# Patient Record
Sex: Female | Born: 1960 | Race: White | Hispanic: Yes | Marital: Single | Smoking: Never smoker
Health system: Southern US, Community
[De-identification: ages and names within clinical notes are randomized; demographics above are authoritative.]

## PROBLEM LIST (undated history)

## (undated) DIAGNOSIS — K4020 Bilateral inguinal hernia, without obstruction or gangrene, not specified as recurrent: Secondary | ICD-10-CM

## (undated) DIAGNOSIS — N952 Postmenopausal atrophic vaginitis: Secondary | ICD-10-CM

## (undated) DIAGNOSIS — E785 Hyperlipidemia, unspecified: Secondary | ICD-10-CM

## (undated) DIAGNOSIS — L089 Local infection of the skin and subcutaneous tissue, unspecified: Secondary | ICD-10-CM

## (undated) DIAGNOSIS — N289 Disorder of kidney and ureter, unspecified: Secondary | ICD-10-CM

## (undated) HISTORY — DX: Hyperlipidemia, unspecified: E78.5

## (undated) HISTORY — DX: Postmenopausal atrophic vaginitis: N95.2

## (undated) HISTORY — DX: Local infection of the skin and subcutaneous tissue, unspecified: L08.9

## (undated) HISTORY — DX: Bilateral inguinal hernia, without obstruction or gangrene, not specified as recurrent: K40.20

---

## 1998-07-14 ENCOUNTER — Encounter: Payer: Self-pay | Admitting: *Deleted

## 1998-07-14 ENCOUNTER — Inpatient Hospital Stay (HOSPITAL_COMMUNITY): Admission: EM | Admit: 1998-07-14 | Discharge: 1998-07-17 | Payer: Self-pay | Admitting: Emergency Medicine

## 1998-07-16 ENCOUNTER — Encounter: Payer: Self-pay | Admitting: *Deleted

## 1998-08-24 ENCOUNTER — Emergency Department (HOSPITAL_COMMUNITY): Admission: EM | Admit: 1998-08-24 | Discharge: 1998-08-24 | Payer: Self-pay | Admitting: Emergency Medicine

## 2000-07-17 ENCOUNTER — Emergency Department (HOSPITAL_COMMUNITY): Admission: EM | Admit: 2000-07-17 | Discharge: 2000-07-17 | Payer: Self-pay | Admitting: Emergency Medicine

## 2001-11-18 ENCOUNTER — Emergency Department (HOSPITAL_COMMUNITY): Admission: EM | Admit: 2001-11-18 | Discharge: 2001-11-18 | Payer: Self-pay | Admitting: Emergency Medicine

## 2003-05-12 ENCOUNTER — Ambulatory Visit (HOSPITAL_COMMUNITY): Admission: RE | Admit: 2003-05-12 | Discharge: 2003-05-12 | Payer: Self-pay | Admitting: Internal Medicine

## 2004-07-14 ENCOUNTER — Ambulatory Visit: Payer: Self-pay | Admitting: Internal Medicine

## 2004-07-19 ENCOUNTER — Ambulatory Visit: Payer: Self-pay | Admitting: Family Medicine

## 2004-07-27 ENCOUNTER — Ambulatory Visit: Payer: Self-pay | Admitting: *Deleted

## 2004-07-30 ENCOUNTER — Ambulatory Visit (HOSPITAL_COMMUNITY): Admission: RE | Admit: 2004-07-30 | Discharge: 2004-07-30 | Payer: Self-pay | Admitting: Family Medicine

## 2004-08-04 ENCOUNTER — Ambulatory Visit: Payer: Self-pay | Admitting: Family Medicine

## 2004-08-10 ENCOUNTER — Encounter: Admission: RE | Admit: 2004-08-10 | Discharge: 2004-08-10 | Payer: Self-pay | Admitting: Internal Medicine

## 2004-08-27 ENCOUNTER — Ambulatory Visit: Payer: Self-pay | Admitting: Family Medicine

## 2004-09-17 ENCOUNTER — Ambulatory Visit: Payer: Self-pay | Admitting: Family Medicine

## 2004-10-22 ENCOUNTER — Ambulatory Visit: Payer: Self-pay | Admitting: Family Medicine

## 2005-01-20 ENCOUNTER — Emergency Department (HOSPITAL_COMMUNITY): Admission: EM | Admit: 2005-01-20 | Discharge: 2005-01-20 | Payer: Self-pay | Admitting: Emergency Medicine

## 2005-08-03 ENCOUNTER — Ambulatory Visit (HOSPITAL_COMMUNITY): Admission: RE | Admit: 2005-08-03 | Discharge: 2005-08-03 | Payer: Self-pay | Admitting: Family Medicine

## 2005-08-05 ENCOUNTER — Ambulatory Visit: Payer: Self-pay | Admitting: Internal Medicine

## 2005-09-23 ENCOUNTER — Encounter: Payer: Self-pay | Admitting: Internal Medicine

## 2005-09-23 ENCOUNTER — Encounter (INDEPENDENT_AMBULATORY_CARE_PROVIDER_SITE_OTHER): Payer: Self-pay | Admitting: Internal Medicine

## 2005-09-23 ENCOUNTER — Ambulatory Visit: Payer: Self-pay | Admitting: Internal Medicine

## 2005-09-23 LAB — CONVERTED CEMR LAB: Pap Smear: NORMAL

## 2006-08-22 ENCOUNTER — Ambulatory Visit: Payer: Self-pay | Admitting: Family Medicine

## 2006-08-24 ENCOUNTER — Ambulatory Visit (HOSPITAL_COMMUNITY): Admission: RE | Admit: 2006-08-24 | Discharge: 2006-08-24 | Payer: Self-pay | Admitting: Family Medicine

## 2006-10-11 ENCOUNTER — Ambulatory Visit: Payer: Self-pay | Admitting: Family Medicine

## 2006-12-11 ENCOUNTER — Encounter (INDEPENDENT_AMBULATORY_CARE_PROVIDER_SITE_OTHER): Payer: Self-pay | Admitting: Internal Medicine

## 2006-12-11 DIAGNOSIS — G5600 Carpal tunnel syndrome, unspecified upper limb: Secondary | ICD-10-CM

## 2006-12-13 ENCOUNTER — Encounter (INDEPENDENT_AMBULATORY_CARE_PROVIDER_SITE_OTHER): Payer: Self-pay | Admitting: *Deleted

## 2007-03-20 DIAGNOSIS — E785 Hyperlipidemia, unspecified: Secondary | ICD-10-CM

## 2007-05-14 ENCOUNTER — Telehealth (INDEPENDENT_AMBULATORY_CARE_PROVIDER_SITE_OTHER): Payer: Self-pay | Admitting: *Deleted

## 2007-05-22 ENCOUNTER — Ambulatory Visit: Payer: Self-pay | Admitting: Family Medicine

## 2007-05-22 DIAGNOSIS — M545 Low back pain: Secondary | ICD-10-CM

## 2007-05-22 DIAGNOSIS — F411 Generalized anxiety disorder: Secondary | ICD-10-CM | POA: Insufficient documentation

## 2007-05-22 DIAGNOSIS — K589 Irritable bowel syndrome without diarrhea: Secondary | ICD-10-CM

## 2008-04-21 ENCOUNTER — Encounter (INDEPENDENT_AMBULATORY_CARE_PROVIDER_SITE_OTHER): Payer: Self-pay | Admitting: Family Medicine

## 2008-05-16 ENCOUNTER — Encounter (INDEPENDENT_AMBULATORY_CARE_PROVIDER_SITE_OTHER): Payer: Self-pay | Admitting: Internal Medicine

## 2008-06-03 ENCOUNTER — Encounter (INDEPENDENT_AMBULATORY_CARE_PROVIDER_SITE_OTHER): Payer: Self-pay | Admitting: Internal Medicine

## 2008-06-12 ENCOUNTER — Ambulatory Visit: Payer: Self-pay | Admitting: Family Medicine

## 2008-06-12 DIAGNOSIS — R6889 Other general symptoms and signs: Secondary | ICD-10-CM | POA: Insufficient documentation

## 2008-06-18 ENCOUNTER — Telehealth (INDEPENDENT_AMBULATORY_CARE_PROVIDER_SITE_OTHER): Payer: Self-pay | Admitting: Family Medicine

## 2008-06-26 ENCOUNTER — Encounter (INDEPENDENT_AMBULATORY_CARE_PROVIDER_SITE_OTHER): Payer: Self-pay | Admitting: *Deleted

## 2008-10-22 ENCOUNTER — Ambulatory Visit: Payer: Self-pay | Admitting: Nurse Practitioner

## 2008-10-22 DIAGNOSIS — J00 Acute nasopharyngitis [common cold]: Secondary | ICD-10-CM | POA: Insufficient documentation

## 2008-10-22 DIAGNOSIS — M25569 Pain in unspecified knee: Secondary | ICD-10-CM | POA: Insufficient documentation

## 2008-10-22 LAB — CONVERTED CEMR LAB
BUN: 7 mg/dL (ref 6–23)
Basophils Absolute: 0 10*3/uL (ref 0.0–0.1)
Basophils Relative: 0 % (ref 0–1)
CO2: 25 meq/L (ref 19–32)
Calcium: 8.9 mg/dL (ref 8.4–10.5)
Cholesterol: 192 mg/dL (ref 0–200)
Creatinine, Ser: 0.49 mg/dL (ref 0.40–1.20)
Eosinophils Absolute: 0.1 10*3/uL (ref 0.0–0.7)
Eosinophils Relative: 2 % (ref 0–5)
Glucose, Bld: 87 mg/dL (ref 70–99)
HCT: 41.5 % (ref 36.0–46.0)
Hemoglobin: 13.3 g/dL (ref 12.0–15.0)
MCHC: 32 g/dL (ref 30.0–36.0)
MCV: 87.4 fL (ref 78.0–100.0)
Monocytes Absolute: 0.4 10*3/uL (ref 0.1–1.0)
Neutro Abs: 3.3 10*3/uL (ref 1.7–7.7)
RDW: 13.7 % (ref 11.5–15.5)
Total Bilirubin: 0.8 mg/dL (ref 0.3–1.2)
Total CHOL/HDL Ratio: 3.5
Triglycerides: 192 mg/dL — ABNORMAL HIGH (ref <150)
VLDL: 38 mg/dL (ref 0–40)

## 2008-10-24 ENCOUNTER — Encounter (INDEPENDENT_AMBULATORY_CARE_PROVIDER_SITE_OTHER): Payer: Self-pay | Admitting: Nurse Practitioner

## 2008-10-28 ENCOUNTER — Ambulatory Visit (HOSPITAL_COMMUNITY): Admission: RE | Admit: 2008-10-28 | Discharge: 2008-10-28 | Payer: Self-pay | Admitting: Internal Medicine

## 2008-11-07 ENCOUNTER — Encounter: Admission: RE | Admit: 2008-11-07 | Discharge: 2008-11-07 | Payer: Self-pay | Admitting: Internal Medicine

## 2008-11-10 ENCOUNTER — Encounter (INDEPENDENT_AMBULATORY_CARE_PROVIDER_SITE_OTHER): Payer: Self-pay | Admitting: Internal Medicine

## 2008-12-03 ENCOUNTER — Ambulatory Visit: Payer: Self-pay | Admitting: Nurse Practitioner

## 2008-12-03 LAB — CONVERTED CEMR LAB
Cholesterol, target level: 200 mg/dL
HDL goal, serum: 40 mg/dL
LDL Goal: 160 mg/dL

## 2009-04-21 ENCOUNTER — Ambulatory Visit: Payer: Self-pay | Admitting: Physician Assistant

## 2009-04-21 DIAGNOSIS — L84 Corns and callosities: Secondary | ICD-10-CM

## 2009-12-31 ENCOUNTER — Ambulatory Visit: Payer: Self-pay | Admitting: Nurse Practitioner

## 2009-12-31 DIAGNOSIS — B373 Candidiasis of vulva and vagina: Secondary | ICD-10-CM

## 2009-12-31 LAB — CONVERTED CEMR LAB
Bilirubin Urine: NEGATIVE
Ketones, urine, test strip: NEGATIVE
Nitrite: NEGATIVE
Specific Gravity, Urine: 1.015

## 2010-01-11 ENCOUNTER — Ambulatory Visit (HOSPITAL_COMMUNITY): Admission: RE | Admit: 2010-01-11 | Discharge: 2010-01-11 | Payer: Self-pay | Admitting: Family Medicine

## 2010-01-18 ENCOUNTER — Ambulatory Visit: Payer: Self-pay | Admitting: Nurse Practitioner

## 2010-01-18 DIAGNOSIS — R300 Dysuria: Secondary | ICD-10-CM | POA: Insufficient documentation

## 2010-01-18 DIAGNOSIS — N952 Postmenopausal atrophic vaginitis: Secondary | ICD-10-CM

## 2010-01-18 LAB — CONVERTED CEMR LAB
ALT: 15 units/L (ref 0–35)
AST: 20 units/L (ref 0–37)
Alkaline Phosphatase: 84 units/L (ref 39–117)
Basophils Absolute: 0 10*3/uL (ref 0.0–0.1)
Basophils Relative: 0 % (ref 0–1)
Blood in Urine, dipstick: NEGATIVE
CO2: 26 meq/L (ref 19–32)
Creatinine, Ser: 0.56 mg/dL (ref 0.40–1.20)
Eosinophils Absolute: 0.1 10*3/uL (ref 0.0–0.7)
Eosinophils Relative: 1 % (ref 0–5)
Glucose, Urine, Semiquant: NEGATIVE
HCT: 43.5 % (ref 36.0–46.0)
Hemoglobin: 14.1 g/dL (ref 12.0–15.0)
Lymphocytes Relative: 49 % — ABNORMAL HIGH (ref 12–46)
MCHC: 32.4 g/dL (ref 30.0–36.0)
MCV: 88.8 fL (ref 78.0–100.0)
Monocytes Absolute: 0.4 10*3/uL (ref 0.1–1.0)
Nitrite: NEGATIVE
Platelets: 260 10*3/uL (ref 150–400)
Protein, U semiquant: NEGATIVE
RDW: 13.3 % (ref 11.5–15.5)
Rapid HIV Screen: NEGATIVE
Sodium: 141 meq/L (ref 135–145)
Specific Gravity, Urine: >=1.03
TSH: 0.744 microintl units/mL (ref 0.350–4.500)
Total Bilirubin: 1.2 mg/dL (ref 0.3–1.2)
Total Protein: 7.3 g/dL (ref 6.0–8.3)

## 2010-01-20 ENCOUNTER — Encounter (INDEPENDENT_AMBULATORY_CARE_PROVIDER_SITE_OTHER): Payer: Self-pay | Admitting: Nurse Practitioner

## 2010-01-20 ENCOUNTER — Telehealth (INDEPENDENT_AMBULATORY_CARE_PROVIDER_SITE_OTHER): Payer: Self-pay | Admitting: Internal Medicine

## 2010-02-01 ENCOUNTER — Telehealth (INDEPENDENT_AMBULATORY_CARE_PROVIDER_SITE_OTHER): Payer: Self-pay | Admitting: Internal Medicine

## 2010-02-02 ENCOUNTER — Ambulatory Visit: Payer: Self-pay | Admitting: Nurse Practitioner

## 2010-02-02 ENCOUNTER — Encounter (INDEPENDENT_AMBULATORY_CARE_PROVIDER_SITE_OTHER): Payer: Self-pay | Admitting: Internal Medicine

## 2010-02-02 LAB — CONVERTED CEMR LAB
HDL: 65 mg/dL (ref >39)
LDL Cholesterol: 145 mg/dL — ABNORMAL HIGH (ref 0–99)
Total CHOL/HDL Ratio: 3.6
Triglycerides: 119 mg/dL (ref <150)

## 2010-02-22 ENCOUNTER — Encounter (INDEPENDENT_AMBULATORY_CARE_PROVIDER_SITE_OTHER): Payer: Self-pay | Admitting: Internal Medicine

## 2010-03-10 ENCOUNTER — Ambulatory Visit: Payer: Self-pay | Admitting: Nurse Practitioner

## 2010-03-10 DIAGNOSIS — N959 Unspecified menopausal and perimenopausal disorder: Secondary | ICD-10-CM | POA: Insufficient documentation

## 2010-03-10 LAB — CONVERTED CEMR LAB
Glucose, Urine, Semiquant: NEGATIVE
Nitrite: NEGATIVE
pH: 6

## 2010-04-27 NOTE — Assessment & Plan Note (Signed)
Summary: right foot problem//gk   Vital Signs:  Patient profile:   50 year old female Menstrual status:  perimenopausal Height:      59 inches Weight:      140.8 pounds Temp:     97.4 degrees F oral Pulse rate:   69 / minute Pulse rhythm:   regular Resp:     17 per minute BP sitting:   125 / 75  (left arm) Cuff size:   regular  Vitals Entered By: Geanie Cooley  (April 21, 2009 3:06 PM) CC: Pt states she has been having alot of pain just in her pinky toe. Pt states that whenever she takes a shower the skin comes off the pinky toe, and whenever she peels off the skin its very painful. Pt states its hard for her to wear sneakers because it hurts and she has to wear sneakers to work. Is Patient Diabetic? No Pain Assessment Patient in pain? yes     Location: foot Intensity: 10 Type: burning  Does patient need assistance? Functional Status Self care Ambulation Normal   CC:  Pt states she has been having alot of pain just in her pinky toe. Pt states that whenever she takes a shower the skin comes off the pinky toe and and whenever she peels off the skin its very painful. Pt states its hard for her to wear sneakers because it hurts and she has to wear sneakers to work..  History of Present Illness: Patient presents with c/o pain on inside of small toe on right. PResent x 2 years. Has tried numerous OTC remedies without success. She has switched to flipflops due to the pain. She is not diabetic. No signs of infection. She has tried tylenol for the pain with relief. The pain is getting worse and she decided to come in to be seen.  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 Advil  Past History:  Past Medical History: Last updated: 12/11/2006 Hyperlipidemia (Hypertriglyceridemia) Mri Brain wi contrast , normal 2/05(reduced grip strength) MRI C spine, 2/05, normal (" ")  Physical Exam  General:  alert, well-developed, and well-nourished.   Head:  normocephalic and atraumatic.   Pulses:  DP/PT 2+ on right Extremities:  no edema Neurologic:  alert & oriented X3 and cranial nerves II-XII intact.   Skin:  right small toe with small callus on medial surface no erythema or discharge  Psych:  normally interactive.     Impression & Recommendations:  Problem # 1:  CALLUS, FOOT (ICD-700)  Complete Medication List: 1)  Naprosyn 500 Mg Tabs (Naproxen) .... One tablet by mouth two times a day as needed for pain  Patient Instructions: 1)  Go to the drug store and ask the pharmacist for: 2)  Salicylic Acid Plaster, 40% for your callus. 3)  Cut small piece to fit on your toe and stick over the callus. 4)  Change daily until resolved. 5)  Get shoes that have plenty of room in the toes.  If your tennis shoes may be tight, buy new shoes to give yourself more room. 6)  Please schedule a follow-up appointment as needed .  Vital Signs:  Patient profile:   50 year old female Menstrual status:  perimenopausal Height:      59 inches Weight:      140.8 pounds Temp:     97.4 degrees F oral Pulse rate:   69 / minute Pulse rhythm:   regular Resp:     17 per minute BP sitting:   125 / 75  (left arm) Cuff size:   regular  Vitals Entered By: Geanie Cooley  (April 21, 2009 3:06 PM)

## 2010-04-27 NOTE — Assessment & Plan Note (Signed)
Summary: Acute - Vaginal atrophy   Vital Signs:  Patient profile:   50 year old female Menstrual status:  postmenopausal Height:      59 inches Weight:      122.9 pounds BMI:     24.91 O2 Sat:      95 % Temp:     97.1 degrees F oral Pulse rate:   77 / minute Pulse rhythm:   regular Resp:     16 per minute BP sitting:   110 / 80  (left arm) Cuff size:   regular  Vitals Entered By: [place] Shannon (January 18, 2010 3:16 PM) CC: PT IS HERE FOR URINE PROBLEMS... PT SAYS HER URINE SMELLS STRONG, SHE HAS BURNING AND ITCHING, Lipid Management Is Patient Diabetic? No Pain Assessment Patient in pain? no       Does patient need assistance? Functional Status Self care Ambulation Normal   Primary Care Provider:  Julieanne Manson MD  CC:  PT IS HERE FOR URINE PROBLEMS... PT SAYS HER URINE SMELLS STRONG, SHE HAS BURNING AND ITCHING, and Lipid Management.  History of Present Illness:  Pt into the office with complaints of vaginal irritation. Dx with candidiasis during last visit and she was treated with clotrimazole.  She used the cream for 7 days and symptoms improved.  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     The patient states that she does not know about the "Therapeutic Lifestyle Change" diet.  The patient does not know about adjunctive measures for cholesterol lowering.  She expresses no side effects from her lipid-lowering medication.  The patient denies any symptoms to suggest myopathy or liver disease.     Current Medications (verified): 1)  Naprosyn 500 Mg Tabs (Naproxen) .... One Tablet By Mouth Two Times A Day As Needed For Pain 2)  Clotrimazole 1 % Crea (Clotrimazole) .... Use Intravaginally At Night X 7 Nights  Allergies (verified): 1)  ! Advil  Review of Systems General:  Complains of sweats. CV:  Denies chest pain or discomfort. Resp:  Denies cough. GI:  Denies abdominal pain, nausea, and vomiting. GU:  Complains of dysuria and urinary frequency; denies discharge; +vaginal dryness. Psych:  Complains of irritability.  Physical Exam  General:  alert.   Head:  normocephalic.   Lungs:  normal breath sounds.   Heart:  normal rate and regular rhythm.   Abdomen:  normal bowel sounds.   Msk:  up to the exam table Neurologic:  alert & oriented X3.   Skin:  color normal.   Psych:  Oriented X3.     Impression & Recommendations:  Problem # 1:  DYSURIA (ICD-788.1) may be due to atrophic vaginitis will treat accordingly and see if symptoms resolve Orders: UA Dipstick w/o Micro (manual) (78295) KOH/ WET Mount [phone])  Problem # 2:  HYPERLIPIDEMIA (ICD-272.4) Assessment: Unchanged pt will return for fasting lab visit Orders: T-Comprehensive Metabolic Panel [phone]) T-CBC w/Diff (28413-24401) Rapid HIV  (02725) T-Syphilis Test (RPR) (36644-03474) T-TSH (25956-38756)  Problem # 3:  VAGINITIS, ATROPHIC (ICD-627.3)  will treat and have pt back for f/u  Her updated medication list for this problem includes:    Estrace 0.1 Mg/gm Crea (Estradiol) .Marland Kitchen... 2gm daily for 2 weeks them reduce to 1gm three times per week for vaginal irritation  Complete Medication List: 1)   Naprosyn 500 Mg Tabs (Naproxen) .... One tablet by mouth two times a day as needed for pain 2)  Estrace 0.1 Mg/gm Crea (Estradiol) .... 2gm daily for 2 weeks them reduce to 1gm three times per week for vaginal irritation  Lipid Assessment/Plan:      Based on NCEP/ATP III, the patient's risk factor category is "0-1 risk factors".  The patient's lipid goals are as follows: Total cholesterol goal is 200; LDL cholesterol goal is 160; HDL cholesterol goal is 40; Triglyceride goal is 150.  Her LDL cholesterol goal has been met.    Patient Instructions: 1)  Schedule an appointment in 2 weeks for fasting labs - lipids 2)  no food after midnight before this visit 3)  Vaginal irritation may be due atrophic vaginitis.  Start the vaginal cream as ordered 4)  Follow up in 4 weeks for vaginal irritation.  Prescriptions: ESTRACE 0.1 MG/GM CREA (ESTRADIOL) 2gm daily for 2 weeks them reduce to 1gm three times per week for vaginal irritation  #30gm x 3   Entered and Authorized by:   Lehman Prom FNP   Signed by:   Lehman Prom FNP on 01/18/2010   Method used:   Print then Give to Patient   RxID:   [id number]    Orders Added: 1)  Est. Patient Level III [01601] 2)  T-Comprehensive Metabolic Panel [80053-22900] 3)  T-CBC w/Diff [09323-55732] 4)  Rapid HIV  [92370] 5)  T-Syphilis Test (RPR) [20254-27062] 6)  T-TSH [37628-31517] 7)  UA Dipstick w/o Micro (manual) [81002] 8)  [facility] [87210]    Laboratory Results   Urine Tests  Date/Time Received: January 18, 2010 4:04 PM   Routine Urinalysis   Glucose: negative   (Normal Range: Negative) Bilirubin: negative   (Normal Range: Negative) Ketone: negative   (Normal Range: Negative) Spec. Gravity: >=1.030   (Normal Range: 1.003-1.035) Blood: negative   (Normal Range: Negative) pH: 5.0   (Normal Range: 5.0-8.0) Protein: negative   (Normal Range: Negative) Urobilinogen: 0.2   (Normal Range: 0-1) Nitrite: negative   (Normal Range:  Negative) Leukocyte Esterace: trace   (Normal Range: Negative)    Date/Time Received: January 18, 2010 4:04 PM   [employer] Source: vaginal WBC/hpf: 1-5 Bacteria/hpf: rare Clue cells/hpf: none Yeast/hpf: none Wet Mount KOH: Negative Trichomonas/hpf: none  Other Tests  Rapid HIV: negative

## 2010-04-27 NOTE — Letter (Signed)
Summary: Handout Printed  Printed Handout:  - Vaginitis, Atrophic, Easy-to-Read 

## 2010-04-27 NOTE — Letter (Signed)
Summary: Lipid Letter  [facility]  [address] [place], [place] [address]   Phone: [phone]  Fax: [phone]    02/22/2010  [facility] [address] [place], [place]  [address]  Dear Brooke Ray:  We have carefully reviewed your last lipid profile from 02/02/2010 and the results are noted below with a summary of recommendations for lipid management.    Cholesterol:       234     Goal: <200   HDL "good" Cholesterol:   65     Goal: >45   LDL "bad" Cholesterol:   145     Goal: <100   Triglycerides:       119     Goal: <150    Your cholesterol is definitely higher than when last checked.  Work on eating 5 servings of vegetables and fruits daily.  Avoid fried food and limit red meat.  I would like to recheck your fasting cholesterol panel again in 6 months to see if you can get this back down with diet and exercise changes--please call to make that appt.   TLC Diet (Therapeutic Lifestyle Change): Saturated Fats & Transfatty acids should be kept < 7% of total calories ***Reduce Saturated Fats Polyunstaurated Fat can be up to 10% of total calories Monounsaturated Fat Fat can be up to 20% of total calories Total Fat should be no greater than 25-35% of total calories Carbohydrates should be 50-60% of total calories Protein should be approximately 15% of total calories Fiber should be at least 20-30 grams a day ***Increased fiber may help lower LDL Total Cholesterol should be < 200mg /day Consider adding plant stanol/sterols to diet (example: Benacol spread) ***A higher intake of unsaturated fat may reduce Triglycerides and Increase HDL    Adjunctive Measures (may lower LIPIDS and reduce risk of Heart Attack) include: Aerobic Exercise (20-30 minutes 3-4 times a week) Limit Alcohol Consumption Weight Reduction Aspirin 75-81 mg a day by mouth (if not allergic or contraindicated) Dietary Fiber 20-30 grams a day by mouth     Current  Medications: 1)    Naprosyn 500 Mg Tabs (Naproxen) .... One tablet by mouth two times a day as needed for pain 2)    Premarin 0.625 Mg/gm Crea (Estrogens, conjugated) .... 2 gm intravaginally nightly for 2 weeks, then decrease to 2-3 times weekly at night--spanish please  If you have any questions, please call. We appreciate being able to work with you.   Sincerely,    [facility] Julieanne Manson MD

## 2010-04-27 NOTE — Letter (Signed)
Summary: Handout Printed  Printed Handout:  - Vaginitis-Brief 

## 2010-04-27 NOTE — Progress Notes (Signed)
Summary: CHANGE MEDICINE  Phone Note Refill Request Call back at [phone]   Refills Requested: Medication #1:  ESTRACE 0.1 MG/GM CREA 2gm daily for 2 weeks them reduce to 1gm three times per week for vaginal irritation. I  WANT TO KNOW IT YOU CAN CHENGE THE MEDICINE FOR DIFERENT ONE, WHENT TO [facility] PRICE IS $ 134 DLLS  Initial call taken by: Domenic Polite,  January 20, 2010 2:54 PM  Follow-up for Phone Call        Sent to N. Daphine Deutscher.  Dutch Quint RN  January 20, 2010 3:00 PM   Additional Follow-up for Phone Call Additional follow up Details #1::        she needs to get this medication from [facility] pharmacy where she can get a discount.   there is no cream available on [facility]'s $4 plan  Additional Follow-up by: Lehman Prom FNP,  January 20, 2010 6:20 PM    Additional Follow-up for Phone Call Additional follow up Details #2::    Advised of Ohn Bostic's response -- via interpreter, verbalized agreement. Dutch Quint RN  January 21, 2010 4:17 PM

## 2010-04-27 NOTE — Progress Notes (Signed)
Summary: ESTRACE O.1 MG REFILL   Phone Note Call from Patient   Caller: Patient Reason for Call: Refill Medication Summary of Call: PT WENT TO THE [place] [facility] AND THEY TOLD HER THAT SHE CAN'T GET THE ESTRACE 0.1 MG  THERE. BECAUSE THEY DON'T HAVE IT THERE . PLEASE, CALL [phone]  THANK YOU  (SPANISH PT)  Initial call taken by: Cheryll Dessert,  February 01, 2010 4:01 PM  Follow-up for Phone Call        Please call and ask pharmacy if they sent out any notification regarding this to our office. Switch to Premarin cream--follow directions as written on Rx. Needs CPP in next 4-6 months to continue to receive Rx Follow-up by: Julieanne Manson MD,  February 03, 2010 12:26 PM  Additional Follow-up for Phone Call Additional follow up Details #1::        pt is aware and have appt nov.21.... for a f/u did you want her to keep that appt Additional Follow-up by: Arm[place] Shannon,  February 03, 2010 3:20 PM    Additional Follow-up for Phone Call Additional follow up Details #2::    Please answer first question.  She really needs a CPP--if the one coming up is not--needs to really get a CPP scheduled instead  Julieanne Manson MD  February 03, 2010 4:16 PM   pharmacy said that med has never been on their med list.... Armenia Shannon  February 04, 2010 12:16 PM  pt has appt for cpp.Marland KitchenMarland KitchenMarland KitchenArmenia Shannon  February 04, 2010 12:20 PM   New/Updated Medications: PREMARIN 0.625 MG/GM CREA (ESTROGENS, CONJUGATED) 2 gm intravaginally nightly for 2 weeks, then decrease to 2-3 times weekly at night--Spanish please Prescriptions: PREMARIN 0.625 MG/GM CREA (ESTROGENS, CONJUGATED) 2 gm intravaginally nightly for 2 weeks, then decrease to 2-3 times weekly at night--Spanish please  #1 month x 5   Entered and Authorized by:   Julieanne Manson MD   Signed by:   Julieanne Manson MD on 02/03/2010   Method used:   Faxed to ...       [facility] (retail)       [address] [place], [place]  [address]       Ph: [phone]       Fax: [phone]   RxID:   [id number]

## 2010-04-27 NOTE — Assessment & Plan Note (Signed)
Summary: Acute - Candidiasis   Vital Signs:  Patient profile:   50 year old female Menstrual status:  postmenopausal Weight:      126.3 pounds BMI:     25.60 Temp:     97.0 degrees F oral Pulse rate:   60 / minute Pulse rhythm:   regular Resp:     16 per minute BP sitting:   120 / 70  (left arm) Cuff size:   regular  Vitals Entered By: Levon Hedger (December 31, 2009 11:43 AM)  Nutrition Counseling: Patient's BMI is greater than 25 and therefore counseled on weight management options. CC: X 2 weeks burning and painful urination.  it went away for a week and has come back again, Dysuria Is Patient Diabetic? No Pain Assessment Patient in pain? yes     Location: stomach, back Intensity: 8 Onset of pain  Constant  Does patient need assistance? Functional Status Self care Ambulation Normal     Menstrual Status postmenopausal Last PAP Result Normal   Primary Care Nancy Manuele:  Julieanne Manson MD  CC:  X 2 weeks burning and painful urination.  it went away for a week and has come back again and Dysuria.  History of Present Illness:    Dysuria      This is a 50 year old woman who presents with Dysuria.  The symptoms began 2 weeks ago.  The intensity is described as moderate-severe.  The patient complains of burning with urination and vaginal itching, but denies urgency, hematuria, and vaginal discharge.  Associated symptoms include back pain and pelvic pain.  The patient denies the following associated symptoms: nausea, vomiting, fever, and abdominal pain.  The patient denies the following risk factors: diabetes and prior antibiotics.  History is significant for recent UTI.   No OTC meds  Postmenopausal - LMP x 2 years ago  Allergies: 1)  ! Advil  Review of Systems General:  Denies fever. CV:  Denies chest pain or discomfort. Resp:  Denies cough. GI:  Complains of abdominal pain; denies nausea and vomiting. GU:  Complains of dysuria; +vaginal itching.  Physical  Exam  General:  normal appearance.   Head:  normocephalic.   Lungs:  normal breath sounds.   Heart:  normal rate and regular rhythm.   Genitalia:  self wet prep   Impression & Recommendations:  Problem # 1:  CANDIDIASIS OF VULVA AND VAGINA (ICD-112.1) handout given Orders: UA Dipstick w/o Micro (manual) ([address]109) KOH/ WET Mount [phone])  Problem # 2:  NEED PROPHYLACTIC VACCINATION&INOCULATION FLU (ICD-V04.81) given today in office  Complete Medication List: 1)  Naprosyn 500 Mg Tabs (Naproxen) .... One tablet by mouth two times a day as needed for pain 2)  Clotrimazole 1 % Crea (Clotrimazole) .... Use intravaginally at night x 7 nights  Other Orders: Flu Vaccine 26yrs + (09811) Admin 1st Vaccine (91478) Admin 1st Vaccine [facility]) [phone])  Patient Instructions: 1)  You have received the flu vaccine today. 2)  You have a yeast infection. 3)  use the vaginal cream at night x 7 nights. 4)  Get this from the health department located at [employer]. 5)  Follow up if symptoms continue or worsen Prescriptions: CLOTRIMAZOLE 1 % CREA (CLOTRIMAZOLE) Use intravaginally at night x 7 nights  #45gm x 0   Entered and Authorized by:   Lehman Prom FNP   Signed by:   Lehman Prom FNP on 12/31/2009   Method used:   Print then Give to Patient  RxID:   [id number]   Laboratory Results   Urine Tests  Date/Time Received: December 31, 2009 12:08 PM   Routine Urinalysis   Color: lt. yellow Appearance: Hazy Glucose: negative   (Normal Range: Negative) Bilirubin: negative   (Normal Range: Negative) Ketone: negative   (Normal Range: Negative) Spec. Gravity: 1.015   (Normal Range: 1.003-1.035) Blood: negative   (Normal Range: Negative) pH: 6.5   (Normal Range: 5.0-8.0) Protein: negative   (Normal Range: Negative) Urobilinogen: 0.2   (Normal Range: 0-1) Nitrite: negative   (Normal Range: Negative) Leukocyte Esterace: trace   (Normal Range: Negative)    Date/Time Received:  December 31, 2009 1:01 PM   Wet Mount/KOH Source: vaginal WBC/hpf: 1-5 Bacteria/hpf: rare Clue cells/hpf: none Yeast/hpf: few Trichomonas/hpf: none     Influenza Vaccine    Vaccine Type: Fluvax 50+    Site: right deltoid    Mfr: GlaxoSmithKline    Dose: 0.5 ml    Route: IM    Given by: Levon Hedger    Exp. Date: 08/2010    Lot #: [id number]    VIS given: 10/20/09 version given December 31, 2009.  Flu Vaccine Consent Questions    Do you have a history of severe allergic reactions to this vaccine? no    Any prior history of allergic reactions to egg and/or gelatin? no    Do you have a sensitivity to the preservative Thimersol? no    Do you have a past history of Guillan-Barre Syndrome? no    Do you currently have an acute febrile illness? no    Have you ever had a severe reaction to latex? no    Vaccine information given and explained to patient? yes    Are you currently pregnant? no    ndc  [phone]  Laboratory Results   Urine Tests    Routine Urinalysis   Color: lt. yellow Appearance: Hazy Glucose: negative   (Normal Range: Negative) Bilirubin: negative   (Normal Range: Negative) Ketone: negative   (Normal Range: Negative) Spec. Gravity: 1.015   (Normal Range: 1.003-1.035) Blood: negative   (Normal Range: Negative) pH: 6.5   (Normal Range: 5.0-8.0) Protein: negative   (Normal Range: Negative) Urobilinogen: 0.2   (Normal Range: 0-1) Nitrite: negative   (Normal Range: Negative) Leukocyte Esterace: trace   (Normal Range: Negative)      Wet Mount Wet Mount KOH: Negative

## 2010-04-27 NOTE — Letter (Signed)
Summary: *HSN Results Follow up  [facility]  [address] [place], [place] [address]   Phone: [phone]  Fax: [phone]      01/20/2010   Farrel Gobble [address] [place], [place]  [address]   Dear  Ms. VICTORIA OVIEDO,                            ____S.Drinkard,FNP   ____D. Gore,FNP       ____B. McPherson,MD   ____V. Rankins,MD    ____E. Mulberry,MD    __X__N. Daphine Deutscher, FNP  ____D. Reche Dixon, MD    ____K. Philipp Deputy, MD    ____Other     This letter is to inform you that your recent test(s):  _______Pap Smear    ___X____Lab Test     _______X-ray    ___X____ is within acceptable limits  _______ requires a medication change  _______ requires a follow-up lab visit  _______ requires a follow-up visit with your provider   Comments:  Labs done during recent office visit are normal.       _________________________________________________________ If you have any questions, please contact our office [phone].                    Sincerely,    Lehman Prom FNP [facility]

## 2010-04-29 NOTE — Assessment & Plan Note (Signed)
Summary: Back Pain   Vital Signs:  Patient profile:   50 year old female Menstrual status:  postmenopausal Weight:      127.8 pounds BMI:     25.91 Temp:     97.5 degrees F oral Pulse rate:   80 / minute Pulse rhythm:   regular Resp:     20 per minute BP sitting:   90 / 60  (left arm) Cuff size:   regular  Vitals Entered By: Levon Hedger (March 10, 2010 10:04 AM)  Nutrition Counseling: Patient's BMI is greater than 25 and therefore counseled on weight management options. CC: painful urination with pain in lower stomach x 1 week  Is Patient Diabetic? No Pain Assessment Patient in pain? no       Does patient need assistance? Functional Status Self care Ambulation Normal   Primary Care [place] [facility]:  Julieanne Manson MD  CC:  painful urination with pain in lower stomach x 1 week .  History of Present Illness:  Pt into the office for back pain and abdominal pain.  Back pain - started 22 days ago, started in upper part of back and radiates down to the lower back. Pain is tolerable with naprosyn.  Takes it every 6 hours for pain Pain is worse during the day as the day goes on. Pt is employed at a hotel in housekeeping and yesterday pain was great. Postmenopausal for 2 years. +hot flashes +night sweats +less pleasure in sex +vaginal dryness +trouble sleeping +frequent urination and vaginal infection +trouble controlling bladder -weight gain (but weight is up 5 pounds since last visit) +trouble concentrating and remembering She was started on premarin cream which she used nightly for 2 weeks then she used three times per week.  Pt reports improvement and she does not have as much vaginal dryness and burning.  Abdominal pain - lower abdominal pain started last week, although pt was seen during last visit with similar complaints  Community liason present today with pt - Spanish  Allergies (verified): 1)  ! Advil  Review of Systems General:  Complains of  sweats. Resp:  Denies cough. GI:  Denies abdominal pain, diarrhea, nausea, and vomiting; lowere. GU:  Complains of dysuria, nocturia, and urinary frequency; denies hematuria; strong odor with urine.  Physical Exam  General:  alert.   Head:  normocephalic.   Ears:  ear piercing(s) noted.   Lungs:  normal breath sounds.   Heart:  normal rate and regular rhythm.   Msk:  paraspinal tenderness along length of spine Neurologic:  alert & oriented X3.     Detailed Back/Spine Exam  Cervical Exam:  Inspection-deformity:    Normal Palpation-spinal tenderness:  Abnormal  Thoracic Exam:  Inspection-deformity:    Normal Palpation-spinal tenderness:  Abnormal  Lumbosacral Exam:  Inspection-deformity:    Normal Palpation-spinal tenderness:  Abnormal   Impression & Recommendations:  Problem # 1:  POSTMENOPAUSAL SYNDROME (ICD-627.9) handout given to pt last PAP done in 2007 - will schedule on pt's next visit Her updated medication list for this problem includes:    Premarin 0.625 Mg/gm Crea (Estrogens, conjugated) .Marland Kitchen... 2 gm intravaginally nightly for 2 weeks, then decrease to 2-3 times weekly at night--spanish please  Problem # 2:  VAGINITIS, ATROPHIC (ICD-627.3)  pt advised to keep using the cream Her updated medication list for this problem includes:    Premarin 0.625 Mg/gm Crea (Estrogens, conjugated) .Marland Kitchen... 2 gm intravaginally nightly for 2 weeks, then decrease to 2-3 times weekly at night--spanish please  Orders: KOH/ WET Mount [id number]) UA Dipstick w/o Micro (manual) (96295)  Problem # 3:  BACK PAIN, LUMBAR (ICD-724.2)  Her updated medication list for this problem includes:    Naprosyn 500 Mg Tabs (Naproxen) ..... One tablet by mouth two times a day as needed for pain    Cyclobenzaprine Hcl 10 Mg Tabs (Cyclobenzaprine hcl) ..... One tablet by mouth nightly as needed for muscle  Complete Medication List: 1)  Naprosyn 500 Mg Tabs (Naproxen) .... One tablet by mouth two  times a day as needed for pain 2)  Premarin 0.625 Mg/gm Crea (Estrogens, conjugated) .... 2 gm intravaginally nightly for 2 weeks, then decrease to 2-3 times weekly at night--spanish please 3)  Fluoxetine Hcl 20 Mg Caps (Fluoxetine hcl) .... One capsule by mouth daily for mood 4)  Cyclobenzaprine Hcl 10 Mg Tabs (Cyclobenzaprine hcl) .... One tablet by mouth nightly as needed for muscle  Patient Instructions: 1)  Back pain - most likely due to muscle strain and overuse due to job. 2)  Get a heat pad and apply to back. 3)  May use naprosyn 500mg  by mouth two times a day as needed for pain (take with food) 4)  Also may use a muscle relaxer at night for pain 5)  Urine and wet prep are ok today. 6)  No infection 7)  Symptoms are most likely due to menopause 8)  Continue to use the vaginal cream 9)  Start fluoxetine 20mg  by mouth daily to help with symptoms 10)  Follow up in 3 months for a complete physical exam. 11)  Will need pap smear Prescriptions: FLUOXETINE HCL 20 MG CAPS (FLUOXETINE HCL) One capsule by mouth daily for mood  #30 x 5   Entered and Authorized by:   Lehman Prom FNP   Signed by:   Lehman Prom FNP on 03/10/2010   Method used:   Faxed to ...       [facility] (retail)       [address] [place], [place]  [address]       Ph: [phone]       Fax: [phone]   RxID:   [id number] CYCLOBENZAPRINE HCL 10 MG TABS (CYCLOBENZAPRINE HCL) One tablet by mouth nightly as needed for muscle  #30 x 0   Entered and Authorized by:   Lehman Prom FNP   Signed by:   Lehman Prom FNP on 03/10/2010   Method used:   Faxed to ...       [facility] (retail)       [address] [place], [place]  [address]       Ph: [phone]       Fax: [phone]   RxID:   [id number] NAPROSYN 500 MG TABS (NAPROXEN) One tablet by mouth two times a day as needed for pain  #50 x 1    Entered and Authorized by:   Lehman Prom FNP   Signed by:   Lehman Prom FNP on 03/10/2010   Method used:   Faxed to ...       [facility] (retail)       [address] [place], [place]  [address]       Ph: [phone]       Fax: [phone]   RxID:   [id number] FLUOXETINE HCL 20 MG CAPS (  FLUOXETINE HCL) One capsule by mouth daily for mood  #30 x 5   Entered and Authorized by:   Lehman Prom FNP   Signed by:   Lehman Prom FNP on 03/10/2010   Method used:   Print then Give to Patient   RxID:   [id number]    Orders Added: 1)  Est. Patient Level III [14782] 2)  KOH/ WET Mount [95621] 3)  UA Dipstick w/o Micro (manual) [81002]    Prevention & Chronic Care Immunizations   Influenza vaccine: Fluvax 3+  (12/31/2009)    Tetanus booster: 11/11/2002: Td    Pneumococcal vaccine: Not documented  Other Screening   Pap smear: Normal  (09/23/2005)    Mammogram: ASSESSMENT: Negative - BI-RADS 1^MM DIGITAL SCREENING  (01/11/2010)   Smoking status: never  (10/22/2008)  Lipids   Total Cholesterol: 234  (02/02/2010)   LDL: 145  (02/02/2010)   LDL Direct: Not documented   HDL: 65  (02/02/2010)   Triglycerides: 119  (02/02/2010)    SGOT (AST): 20  (01/18/2010)   SGPT (ALT): 15  (01/18/2010)   Alkaline phosphatase: 84  (01/18/2010)   Total bilirubin: 1.2  (01/18/2010)  Self-Management Support :    Lipid self-management support: Not documented    Laboratory Results   Urine Tests  Date/Time Received: March 10, 2010 10:58 AM   Routine Urinalysis   Color: Hazy Glucose: negative   (Normal Range: Negative) Bilirubin: negative   (Normal Range: Negative) Ketone: negative   (Normal Range: Negative) Spec. Gravity: >=1.030   (Normal Range: 1.003-1.035) Blood: trace-intact   (Normal Range: Negative) pH: 6.0   (Normal Range: 5.0-8.0) Protein: negative   (Normal Range: Negative) Urobilinogen: 0.2   (Normal  Range: 0-1) Nitrite: negative   (Normal Range: Negative) Leukocyte Esterace: trace   (Normal Range: Negative)    Date/Time Received: March 10, 2010 11:06 AM   Wet Mount Source: vaginal WBC/hpf: 1-5 Bacteria/hpf: rare Clue cells/hpf: none Yeast/hpf: none Wet Mount KOH: Negative Trichomonas/hpf: none

## 2010-06-07 ENCOUNTER — Other Ambulatory Visit: Payer: Self-pay | Admitting: Nurse Practitioner

## 2010-06-07 ENCOUNTER — Encounter (INDEPENDENT_AMBULATORY_CARE_PROVIDER_SITE_OTHER): Payer: Self-pay | Admitting: Nurse Practitioner

## 2010-06-07 ENCOUNTER — Encounter: Payer: Self-pay | Admitting: Nurse Practitioner

## 2010-06-07 LAB — CONVERTED CEMR LAB
Blood in Urine, dipstick: NEGATIVE
Ketones, urine, test strip: NEGATIVE
Nitrite: NEGATIVE
Urobilinogen, UA: 0.2

## 2010-06-08 ENCOUNTER — Encounter (INDEPENDENT_AMBULATORY_CARE_PROVIDER_SITE_OTHER): Payer: Self-pay | Admitting: Nurse Practitioner

## 2010-06-08 LAB — CONVERTED CEMR LAB
Chlamydia, DNA Probe: NEGATIVE
GC Probe Amp, Genital: NEGATIVE
HDL: 66 mg/dL (ref >39)
Total CHOL/HDL Ratio: 4
Triglycerides: 155 mg/dL — ABNORMAL HIGH (ref <150)

## 2010-06-15 NOTE — Assessment & Plan Note (Signed)
Summary: Complete Physical Exam   Vital Signs:  Patient profile:   50 year old female Menstrual status:  postmenopausal Weight:      129 pounds BMI:     26.15 Temp:     98.1 degrees F oral Pulse rate:   66 / minute Pulse rhythm:   regular Resp:     16 per minute BP sitting:   108 / 74  (left arm) Cuff size:   regular  Vitals Entered By: Levon Hedger (June 07, 2010 8:48 AM)  Nutrition Counseling: Patient's BMI is greater than 25 and therefore counseled on weight management options. CC: Lipid Management, Depression Is Patient Diabetic? No Pain Assessment Patient in pain? no       Does patient need assistance? Functional Status Self care Ambulation Normal   Primary Care Provider:  Julieanne Manson MD  CC:  Lipid Management and Depression.  History of Present Illness:  Pt into the office for a complete physical exam  PAP - Last pap done 1 year ago with normal results Hx of atrophic vaginiitis - using vaginal cream as ordered Post menopausal 4 children  Mammogram - last mammogram done 01/11/2010 no self breast exam at home  Optho - no glasses or contacts but she does have some headaches due to problems with vision  Dental - No recent dental exam. ? molars on right side ar painful at time.    Pt presents today with all her medications with her today.  Depression History:      The patient denies a depressed mood most of the day and a diminished interest in her usual daily activities.  The patient denies recurrent thoughts of death or suicide.        The patient denies that she feels like life is not worth living, denies that she wishes that she were dead, and denies that she has thought about ending her life.         Depression Treatment History:  Prior Medication Used:   Start Date: Assessment of Effect:   Comments:  Prozac (fluoxetine)     03/10/2010   some improvement     continue  Lipid Management History:      Negative NCEP/ATP III risk factors  include female age less than 70 years old, no history of early menopause without estrogen hormone replacement, non-diabetic, HDL cholesterol greater than 60, no family history for ischemic heart disease, non-tobacco-user status, non-hypertensive, no ASHD (atherosclerotic heart disease), no prior stroke/TIA, no peripheral vascular disease, and no history of aortic aneurysm.        The patient states that she knows about the "Therapeutic Lifestyle Change" diet.  Her compliance with the TLC diet is fair.  The patient expresses understanding of adjunctive measures for cholesterol lowering.  Comments include: no current meds.      Habits & Providers  Alcohol-Tobacco-Diet     Alcohol drinks/day: 0     Tobacco Status: never     Passive Smoke Exposure: no  Exercise-Depression-Behavior     Does Patient Exercise: no     Have you felt down or hopeless? no     Have you felt little pleasure in things? no     Depression Counseling: not indicated; screening negative for depression     Drug Use: no     Seat Belt Use: 100     Sun Exposure: occasionally  Allergies (verified): 1)  ! Advil  Review of Systems General:  Denies fever. Eyes:  Complains of blurring; decrease  in visual acuity. ENT:  Denies earache. CV:  Denies fatigue. Resp:  Denies chest discomfort. GI:  Denies abdominal pain, nausea, and vomiting. GU:  Denies dysuria. MS:  Denies joint pain. Derm:  Denies dryness. Neuro:  Complains of headaches. Psych:  Complains of anxiety; denies depression.  Physical Exam  General:  alert.   Head:  normocephalic.   Eyes:  pupils round and pupils reactive to light.   Ears:  R ear normal and L ear normal.  bil TM visible Nose:  no nasal discharge.   Mouth:  pharynx pink and moist and fair dentition.   Neck:  supple.   Chest Wall:  no mass.   Breasts:  no abnormal thickening.   Lungs:  normal breath sounds.   Heart:  normal rate and regular rhythm.   Abdomen:  normal bowel sounds.     Rectal:  no external abnormalities.    Pelvic Exam  Vulva:      normal appearance.   Urethra and Bladder:      Urethra--normal.  Bladder--normal.   Vagina:      physiologic discharge, post-menopausal.   Cervix:      midposition.   Uterus:      smooth.   Adnexa:      nontender bilaterally.   Rectum:      normal, heme negative stool.      Impression & Recommendations:  Problem # 1:  ROUTINE GYNECOLOGICAL EXAMINATION (ICD-V72.31) rec optho and dental exam PHQ-9 score = 11 PAP done Orders: KOH/ WET Mount [phone]) Pap Smear, Thin Prep ( Collection of) (U0454) UA Dipstick w/o Micro (manual) (09811) T- GC Chlamydia (91478) Hemoccult Guaiac-1 spec.(in office) (82270) EKG w/ Interpretation (93000)  Problem # 2:  ANXIETY STATE, UNSPECIFIED (ICD-300.00) continue current dose of meds Her updated medication list for this problem includes:    Fluoxetine Hcl 20 Mg Caps (Fluoxetine hcl) ..... One capsule by mouth daily for mood  Problem # 3:  HYPERLIPIDEMIA (ICD-272.4) will check labs today no current meds Orders: T-Lipid Profile ([id number])  Problem # 4:  VAGINITIS, ATROPHIC (ICD-627.3)  Her updated medication list for this problem includes:    Premarin 0.625 Mg/gm Crea (Estrogens, conjugated) .Marland Kitchen... 2 gm intravaginally nightly for 2 weeks, then decrease to 2-3 times weekly at night--spanish please  Complete Medication List: 1)  Naprosyn 500 Mg Tabs (Naproxen) .... One tablet by mouth two times a day as needed for pain 2)  Premarin 0.625 Mg/gm Crea (Estrogens, conjugated) .... 2 gm intravaginally nightly for 2 weeks, then decrease to 2-3 times weekly at night--spanish please 3)  Fluoxetine Hcl 20 Mg Caps (Fluoxetine hcl) .... One capsule by mouth daily for mood 4)  Cyclobenzaprine Hcl 10 Mg Tabs (Cyclobenzaprine hcl) .... One tablet by mouth nightly as needed for muscle  Other Orders: T-Culture, Urine ([id number])  Lipid Assessment/Plan:      Based on NCEP/ATP III,  the patient's risk factor category is "0-1 risk factors".  The patient's lipid goals are as follows: Total cholesterol goal is 200; LDL cholesterol goal is 160; HDL cholesterol goal is 40; Triglyceride goal is 150.  Her LDL cholesterol goal has been met.    Patient Instructions: 1)  You should get your eyes checked at [facility].  This would be most affordable for you.  It is very important that you get your eyes checked 2)  You can get dental cleaning and simple fillings from either [facility] [phone] Ext [phone] [facility] (see handout) 3)  At this  time the dental clinic associated with [facility] is NOT taking any new patients at this time. 4)  Your cholesterol will be checked today and you will be notified of the results 5)  Follow up every 6 months for cholesterol and mood.   Orders Added: 1)  Est. Patient age 80-64 [7] 2)  KOH/ WET Mount [phone] 3)  Pap Smear, Thin Prep ( Collection of) [Q0091] 4)  UA Dipstick w/o Micro (manual) [81002] 5)  T- GC Chlamydia [60454] 6)  Hemoccult Guaiac-1 spec.(in office) [82270] 7)  EKG w/ Interpretation [93000] 8)  T-Lipid Profile [[id number]] 9)  T-Culture, Urine [[id number]]    Laboratory Results   Urine Tests  Date/Time Received: June 07, 2010 9:40 AM   Routine Urinalysis   Color: red Glucose: negative   (Normal Range: Negative) Bilirubin: negative   (Normal Range: Negative) Ketone: negative   (Normal Range: Negative) Spec. Gravity: >=1.030   (Normal Range: 1.003-1.035) Blood: negative   (Normal Range: Negative) pH: 5.0   (Normal Range: 5.0-8.0) Protein: negative   (Normal Range: Negative) Urobilinogen: 0.2   (Normal Range: 0-1) Nitrite: negative   (Normal Range: Negative) Leukocyte Esterace: large   (Normal Range: Negative)    Date/Time Received: June 07, 2010 9:44 AM   Wet Mount Source: vaginal WBC/hpf: 1-5 Bacteria/hpf: rare Clue cells/hpf: none Yeast/hpf: none Wet Mount KOH: Negative Trichomonas/hpf:  none  Stool - Occult Blood Hemmoccult #1: negative Date: 06/07/2010     EKG  Procedure date:  06/07/2010  Findings:      sinus rhythm

## 2010-06-15 NOTE — Letter (Signed)
Summary: Lipid Letter  [facility]  [address] [place], [place] [address]   Phone: [phone]  Fax: [phone]    06/08/2010  [facility] [address] [place], [place]  [address]  Dear Brooke Ray:  We have carefully reviewed your last lipid profile from 06/08/2010 and the results are noted below with a summary of recommendations for lipid management.    Cholesterol:       267     Goal: less than 200   HDL "good" Cholesterol:   66     Goal: greater than 40   LDL "bad" Cholesterol:   170     Goal: less than 130   Triglycerides:       155     Goal: less than 150    Your cholesterol is very high.  You should have been contacted by this office about the need to start medications  You should also start a low fat, low cholesterol diet. You will need your cholesterol rechecked in 6-8 weeks after starting the medication AND diet changes.     Current Medications: 1)    Naprosyn 500 Mg Tabs (Naproxen) .... One tablet by mouth two times a day as needed for pain 2)    Premarin 0.625 Mg/gm Crea (Estrogens, conjugated) .... 2 gm intravaginally nightly for 2 weeks, then decrease to 2-3 times weekly at night--spanish please 3)    Fluoxetine Hcl 20 Mg Caps (Fluoxetine hcl) .... One capsule by mouth daily for mood 4)    Cyclobenzaprine Hcl 10 Mg Tabs (Cyclobenzaprine hcl) .... One tablet by mouth nightly as needed for muscle 5)    Pravastatin Sodium 20 Mg Tabs (Pravastatin sodium) .... One tablet by mouth nightly for cholesterol  If you have any questions, please call. We appreciate being able to work with you.   Sincerely,    [facility] Lehman Prom, FNP

## 2010-06-15 NOTE — Letter (Signed)
Summary: Handout Printed  Printed Handout:  - Diet - Low-Cholesterol Guidelines 

## 2010-06-16 ENCOUNTER — Encounter (INDEPENDENT_AMBULATORY_CARE_PROVIDER_SITE_OTHER): Payer: Self-pay | Admitting: Nurse Practitioner

## 2010-11-02 ENCOUNTER — Emergency Department (HOSPITAL_COMMUNITY)
Admission: EM | Admit: 2010-11-02 | Discharge: 2010-11-02 | Disposition: A | Discharge status: Home / Self Care | Payer: Self-pay | Attending: Emergency Medicine | Admitting: Emergency Medicine

## 2010-11-02 ENCOUNTER — Emergency Department (HOSPITAL_COMMUNITY): Payer: Self-pay

## 2010-11-02 DIAGNOSIS — L03039 Cellulitis of unspecified toe: Secondary | ICD-10-CM | POA: Insufficient documentation

## 2010-11-02 DIAGNOSIS — L02619 Cutaneous abscess of unspecified foot: Secondary | ICD-10-CM | POA: Insufficient documentation

## 2010-11-02 DIAGNOSIS — M79609 Pain in unspecified limb: Secondary | ICD-10-CM | POA: Insufficient documentation

## 2011-05-04 ENCOUNTER — Other Ambulatory Visit: Payer: Self-pay | Admitting: Internal Medicine

## 2011-05-04 DIAGNOSIS — Z1231 Encounter for screening mammogram for malignant neoplasm of breast: Secondary | ICD-10-CM

## 2011-05-10 ENCOUNTER — Other Ambulatory Visit: Payer: Self-pay | Admitting: Internal Medicine

## 2011-05-10 ENCOUNTER — Ambulatory Visit (HOSPITAL_COMMUNITY)
Admission: RE | Admit: 2011-05-10 | Discharge: 2011-05-10 | Disposition: A | Discharge status: Home / Self Care | Payer: Self-pay | Source: Ambulatory Visit | Attending: Internal Medicine | Admitting: Internal Medicine

## 2011-05-10 DIAGNOSIS — Z1231 Encounter for screening mammogram for malignant neoplasm of breast: Secondary | ICD-10-CM

## 2011-08-25 ENCOUNTER — Emergency Department (HOSPITAL_COMMUNITY): Payer: Self-pay

## 2011-08-25 ENCOUNTER — Encounter (HOSPITAL_COMMUNITY): Payer: Self-pay | Admitting: *Deleted

## 2011-08-25 ENCOUNTER — Emergency Department (HOSPITAL_COMMUNITY)
Admission: EM | Admit: 2011-08-25 | Discharge: 2011-08-25 | Disposition: A | Discharge status: Home / Self Care | Payer: Self-pay | Attending: Emergency Medicine | Admitting: Emergency Medicine

## 2011-08-25 DIAGNOSIS — R1011 Right upper quadrant pain: Secondary | ICD-10-CM | POA: Insufficient documentation

## 2011-08-25 DIAGNOSIS — R21 Rash and other nonspecific skin eruption: Secondary | ICD-10-CM | POA: Insufficient documentation

## 2011-08-25 DIAGNOSIS — R109 Unspecified abdominal pain: Secondary | ICD-10-CM

## 2011-08-25 DIAGNOSIS — R221 Localized swelling, mass and lump, neck: Secondary | ICD-10-CM | POA: Insufficient documentation

## 2011-08-25 DIAGNOSIS — R220 Localized swelling, mass and lump, head: Secondary | ICD-10-CM | POA: Insufficient documentation

## 2011-08-25 HISTORY — DX: Disorder of kidney and ureter, unspecified: N28.9

## 2011-08-25 LAB — COMPREHENSIVE METABOLIC PANEL
ALT: 15 U/L (ref 0–35)
AST: 21 U/L (ref 0–37)
Albumin: 3.5 g/dL (ref 3.5–5.2)
Alkaline Phosphatase: 115 U/L (ref 39–117)
BUN: 9 mg/dL (ref 6–23)
CO2: 26 mEq/L (ref 19–32)
Calcium: 9.1 mg/dL (ref 8.4–10.5)
Chloride: 104 mEq/L (ref 96–112)
Creatinine, Ser: 0.56 mg/dL (ref 0.50–1.10)
GFR calc Af Amer: >90 mL/min (ref >90)
GFR calc non Af Amer: >90 mL/min (ref >90)
Glucose, Bld: 92 mg/dL (ref 70–99)
Potassium: 3.8 mEq/L (ref 3.5–5.1)
Sodium: 139 mEq/L (ref 135–145)
Total Bilirubin: 0.8 mg/dL (ref 0.3–1.2)
Total Protein: 7.4 g/dL (ref 6.0–8.3)

## 2011-08-25 LAB — URINALYSIS, ROUTINE W REFLEX MICROSCOPIC
Bilirubin Urine: NEGATIVE
Glucose, UA: NEGATIVE mg/dL
Hgb urine dipstick: NEGATIVE
Ketones, ur: NEGATIVE mg/dL
Nitrite: NEGATIVE
Protein, ur: NEGATIVE mg/dL
Specific Gravity, Urine: 1.021 (ref 1.005–1.030)
Urobilinogen, UA: 0.2 mg/dL (ref 0.0–1.0)
pH: 5.5 (ref 5.0–8.0)

## 2011-08-25 LAB — CBC
HCT: 38.7 % (ref 36.0–46.0)
Hemoglobin: 12.7 g/dL (ref 12.0–15.0)
MCH: 28.9 pg (ref 26.0–34.0)
MCHC: 32.8 g/dL (ref 30.0–36.0)
MCV: 88 fL (ref 78.0–100.0)
Platelets: 220 10*3/uL (ref 150–400)
RBC: 4.4 MIL/uL (ref 3.87–5.11)
RDW: 13.4 % (ref 11.5–15.5)
WBC: 11.4 10*3/uL — ABNORMAL HIGH (ref 4.0–10.5)

## 2011-08-25 LAB — DIFFERENTIAL
Basophils Absolute: 0 10*3/uL (ref 0.0–0.1)
Basophils Relative: 0 % (ref 0–1)
Eosinophils Absolute: 0.1 10*3/uL (ref 0.0–0.7)
Eosinophils Relative: 1 % (ref 0–5)
Lymphocytes Relative: 30 % (ref 12–46)
Lymphs Abs: 3.4 10*3/uL (ref 0.7–4.0)
Monocytes Absolute: 0.8 10*3/uL (ref 0.1–1.0)
Monocytes Relative: 7 % (ref 3–12)
Neutro Abs: 7.1 10*3/uL (ref 1.7–7.7)
Neutrophils Relative %: 62 % (ref 43–77)

## 2011-08-25 LAB — URINE MICROSCOPIC-ADD ON

## 2011-08-25 LAB — LIPASE, BLOOD: Lipase: 35 U/L (ref 11–59)

## 2011-08-25 MED ORDER — HYDROCODONE-ACETAMINOPHEN 5-325 MG PO TABS: 1.0000 | ORAL_TABLET | Freq: Four times a day (QID) | ORAL | Status: AC | PRN

## 2011-08-25 MED ORDER — IOHEXOL 300 MG/ML  SOLN
100.0000 mL | Freq: Once | INTRAMUSCULAR | Status: AC | PRN
  Administered 2011-08-25: 100 mL via INTRAVENOUS

## 2011-08-25 MED ORDER — HYDROCODONE-ACETAMINOPHEN 5-325 MG PO TABS
1.0000 | ORAL_TABLET | Freq: Once | ORAL | Status: AC
  Administered 2011-08-25: 1 via ORAL
  Filled 2011-08-25: qty 1

## 2011-08-25 MED ORDER — IOHEXOL 300 MG/ML  SOLN
20.0000 mL | INTRAMUSCULAR | Status: AC
  Administered 2011-08-25: 20 mL via ORAL

## 2011-08-25 MED ORDER — SODIUM CHLORIDE 0.9 % IV BOLUS (SEPSIS)
1000.0000 mL | Freq: Once | INTRAVENOUS | Status: AC
  Administered 2011-08-25: 1000 mL via INTRAVENOUS

## 2011-08-25 NOTE — Discharge Instructions (Signed)
Dolor abdominal (Abdominal Pain) El dolor abdominal o dolor en el estmago puede ser causado por muchos factores. El profesional que lo asiste decidir la gravedad de la causa dolor por medio de un examen fsico y Landscape architect pruebas de y Recruitment consultant. Muchos de estos casos pueden controlarse y tratarse en casa. La mayor parte de los dolores en la zona abdominal que padecen los nios es funcional. Esto significa que no est originado en ninguna enfermedad y que probablemente mejorar sin tratamiento. INSTRUCCIONES PARA EL CUIDADO DOMICILIARIO  No tome ni administre laxantes a menos que se lo haya indicado el profesional que lo asiste.   Utilice los medicamentos de venta libre o de prescripcin para Chief Technology Officer, Environmental health practitioner o la [place], segn se lo indique el profesional que lo asiste.   Tome medicacin para el alivio del dolor slo si se lo ha indicado el profesional que lo asiste.   Consuma una dieta lquida: caldo, t o agua el tiempo que se lo indique su mdico. Luego podr gradualmente consumir una dieta blanda segn lo que tolere cada [place].  SOLICITE ATENCIN MDICA DE INMEDIATO SI:  El dolor persiste.   Tiene fiebre.   Presenta vmitos repetidas veces.   Siente el dolor slo en algunos sectores del abdomen (vientre). Si se localiza en la zona derecha, posiblemente podra tratarse de apendicitis. En un adulto, si se localiza en la regin inferior izquierda del abdomen, podra tratarse de colitis o diverticulitis.   Hay sangre en las heces (deposiciones de color rojo brillante o negro alquitranado).  EST SEGURO QUE:   Comprende las instrucciones para el alta mdica.   Controlar su enfermedad.   Solicitar atencin mdica de inmediato segn las indicaciones.  Document Released: 03/14/2005 Document Revised: 03/03/2011 [facility] Patient Information 2012 [place], [place].  Foliculitis (Folliculitis) La foliculitis es una infeccin e inflamacin de los folculos pilosos. Estos se  vuelven rojos e irritados y forman lesiones con pus. La foliculitis puede curarse por s misma en algunas semanas o durar ms tiempo y Chartered loss adjuster. CAUSAS La causa ms comn de la foliculitis es la infeccin ocasionada por una bacteria (germen) Las infecciones virales y los hongos tambin pueden causar este trastorno. Las infecciones virales pueden ser ms comunes en aquellos con el sistema inmunolgico (sistema del organismo que ayuda a Industrial/product designer las enfermedades ) debilitado, como los que sufren [place], los que han sido sometidos a un transplante de rganos y a los pacientes con Database administrator. Los ejemplos incluyen personas con:  SIDA.   Transplante de rganos.   Cncer.  Las personas con el sistema inmune deprimido, diabetes u obesidad, tienen un riesgo mayor de contraer foliculitis que la poblacin en general. Ciertas sustancias qumicas, especialmente aceites y alquitrn tambin pueden causar este trastorno. SNTOMAS  Un signo temprano de foliculitis es una pequea lesin que pica (pstula), que contiene pus blanco o amarillento, en un folculo inflamado y rojo. Generalmente miden menos de 5 mm. (0.20 pulgadas).   El lugar ms probable de comienzo es el cuero cabelludo, muslos, piernas, espalda y [place]. La foliculitis tambin aparece con frecuencia en las zonas de rasurado repetido.   Cuando la infeccin de un folculo se hace ms profunda, se forma un divieso o fornculo. Un grupo de diviesos apiados crean una lesin ms grande denominada ntrax (en ingls se denomina carbuncle). Las lesiones tienden a producirse en reas de sudoracin y que tienen gran cantidad de vello.  TRATAMIENTO  Generalmente los dermatlogos tratan los casos leves de foliculitis con antispticos  combinados con antibiticos tpicos (una sustancia que se aplica en la piel y que destruye grmenes).   El aceite de [address] o del rbol de t, que se obtiene en las herboristeras, tambin es un excelente  antisptico tpico. Un pequeo porcentaje de individuos puede desarrollar alergia a este aceite.   Los fornculos de tamao pequeo o moderado responden bien a las compresas de agua tibia aplicadas tres veces por Futures trader.   En algunos casos el tratamiento de la piel deber acompaarse de antibiticos por va oral.   La eleccin del antibitico se realiza en base a la causa que se sospecha. Si las lesiones contienen grandes cantidades de pus o lquido, el profesional que lo asiste podr drenarlos. Esto permite que el antibitico llegue [employer] reas afectadas.   Los casos de foliculitis rebelde pueden responder bien a la extirpacin del pelo con Production manager. En este procedimiento se Aeronautical engineer (un haz de luz de alta intensidad) para destruir el folculo. De este modo se reducen las cicatrices que son resultado de este trastorno. No obstante, en el rea tratada no volver a crecer pelo.  Los pacientes que sufren foliculitis desde hace mucho tiempo y que no responde al tratamiento, pueden requerir estudios para Artist origen de la infeccin (de dnde proviene). Grmenes puede vivir en los orificios nasales del paciente, y Corporate investment banker brotes de manera intermitente. Algunas veces las bacterias viven en las fosas nasales de un miembro de la familia que no desarrolla la enfermedad, pero expone repetidamente al paciente a tener contacto con el germen. Para cortar este ciclo de recurrencia, ese familiar tambin debe someterse al tratamiento. PREVENCIN  Los individuos predispuestos a la foliculitis debern ser extremadamente cuidadosos en su higiene personal.   La aplicacin de un antisptico puede ayudar a prevenir las recurrencias.   Una crema tpica con antibitico, mupirocina (Bactroban), ha demostrado su efectividad en la reduccin de la colonizacin bacteriana de las fosas nasales. Se aplica dentro de la nariz con el dedo [place], [employer] por da, durante [place], y deber repetirse cada 6  meses.   Una persona que se expone con frecuencia al aceite, alquitrn o a otras sustancias qumicas irritantes deber evitarlas o usar Geologist, engineering. Los pacientes con diabetes, obesidad o compromiso del sistema inmunolgico (no funciona como debera Media planner), tienen que saber que la foliculitis es una consecuencia posible de su trastorno.  SOLICITE ATENCIN MDICA DE INMEDIATO SI:  Presenta enrojecimiento, hinchazn o aumento del dolor en la zona.   Tiene fiebre.   No mejora con el tratamiento o empeora.   Tiene otras preguntas o preocupaciones.  Document Released: 03/14/2005 Document Revised: 03/03/2011 [facility] Patient Information 2012 [place], [place].  Folliculitis  Folliculitis is an infection and inflammation of the hair follicles. Hair follicles become red and irritated. This inflammation is usually caused by bacteria. The bacteria thrive in warm, moist environments. This condition can be seen anywhere on the body.  CAUSES The most common cause of folliculitis is an infection by germs (bacteria). Fungal and viral infections can also cause the condition. Viral infections may be more common in people whose bodies are unable to fight disease well (weakened immune systems). Examples include people with:  AIDS.   An organ transplant.   Cancer.  People with depressed immune systems, diabetes, or obesity, have a greater risk of getting folliculitis than the general population. Certain chemicals, especially oils and tars, also can cause folliculitis. SYMPTOMS  An early sign of folliculitis is a small,  white or yellow pus-filled, itchy lesion (pustule). These lesions appear on a red, inflamed follicle. They are usually less than 5 mm (.20 inches).   The most likely starting points are the scalp, thighs, legs, back and buttocks. Folliculitis is also frequently found in areas of repeated shaving.   When an infection of the follicle goes deeper, it becomes a boil or furuncle.  A group of closely packed boils create a larger lesion (a carbuncle). These sores (lesions) tend to occur in hairy, sweaty areas of the body.  TREATMENT   A doctor who specializes in skin problems (dermatologists) treats mild cases of folliculitis with antiseptic washes.   They also use a skin application which kills germs (topical antibiotics). Tea tree oil is a good topical antiseptic as well. It can be found at a health food store. A small percentage of individuals may develop an allergy to the tea tree oil.   Mild to moderate boils respond well to warm water compresses applied three times daily.   In some cases, oral antibiotics should be taken with the skin treatment.   If lesions contain large quantities of pus or fluid, your caregiver may drain them. This allows the topical antibiotics to get to the affected areas better.   Stubborn cases of folliculitis may respond to laser hair removal. This process uses a high intensity light beam (a laser) to destroy the follicle and reduces the scarring from folliculitis. After laser hair removal, hair will no longer grow in the laser treated area.  Patients with long-lasting folliculitis need to find out where the infection is coming from. Germs can live in the nostrils of the patient. This can trigger an outbreak now and then. Sometimes the bacteria live in the nostrils of a family member. This person does not develop the disorder but they repeatedly re-expose others to the germ. To break the cycle of recurrence in the patient, the family member must also undergo treatment. PREVENTION   Individuals who are predisposed to folliculitis should be extremely careful about personal hygiene.   Application of antiseptic washes may help prevent recurrences.   A topical antibiotic cream, mupirocin (Bactroban), has been effective at reducing bacteria in the nostrils. It is applied inside the nose with your little finger. This is done twice daily for a week.  Then it is repeated every 6 months.   Because follicle disorders tend to come back, patients must receive follow-up care. Your caregiver may be able to recognize a recurrence before it becomes severe.  SEEK IMMEDIATE MEDICAL CARE IF:   You develop redness, swelling, or increasing pain in the area.   You have a fever.   You are not improving with treatment or are getting worse.   You have any other questions or concerns.  Document Released: 05/23/2001 Document Revised: 03/03/2011 Document Reviewed: 03/19/2008 [facility] Patient Information 2012 [place], [place].Abdominal Pain Abdominal pain can be caused by many things. Your caregiver decides the seriousness of your pain by an examination and possibly blood tests and X-rays. Many cases can be observed and treated at home. Most abdominal pain is not caused by a disease and will probably improve without treatment. However, in many cases, more time must pass before a clear cause of the pain can be found. Before that point, it may not be known if you need more testing, or if hospitalization or surgery is needed. HOME CARE INSTRUCTIONS   Do not take laxatives unless directed by your caregiver.   Take pain medicine only  as directed by your caregiver.   Only take over-the-counter or prescription medicines for pain, discomfort, or fever as directed by your caregiver.   Try a clear liquid diet (broth, tea, or water) for as long as directed by your caregiver. Slowly move to a bland diet as tolerated.  SEEK IMMEDIATE MEDICAL CARE IF:   The pain does not go away.   You have a fever.   You keep throwing up (vomiting).   The pain is felt only in portions of the abdomen. Pain in the right side could possibly be appendicitis. In an adult, pain in the left lower portion of the abdomen could be colitis or diverticulitis.   You pass bloody or black tarry stools.  MAKE SURE YOU:   Understand these instructions.   Will watch your condition.   Will  get help right away if you are not doing well or get worse.  Document Released: 12/22/2004 Document Revised: 03/03/2011 Document Reviewed: 10/31/2007 [facility] Patient Information 2012 [place], [place].

## 2011-08-25 NOTE — ED Notes (Signed)
NP made aware that pt is having pain in her scalp.

## 2011-08-25 NOTE — ED Notes (Signed)
CT notified that pt has finished contrast.  

## 2011-08-25 NOTE — ED Provider Notes (Signed)
Lab and radiology results discussed with patient and family.  Patient understands a little English, but fluent family member assisted with translation.  Patient is currently abdominal pain free.  Mild leukocytosis (11.4) without shift.  CBC otherwise normal.  Normal chemistry profile without elevation in liver enzymes or lipase.  No acute abnormalities noted on CT or [place].  Urinalysis indicates elevated leukocyte esterace, however is contaminated with numerous epithelial cells.  Will culture and defer treatment until results obtained.   Patient does complain of scalp discomfort where pre-existing pustular lesions are present.  Patient has been evaluated by her PCP and dermatology, currently taking doxycycline for same.  Patient has an appointment with her PCP on Monday.    Jimmye Norman, NP 08/25/11 [phone]

## 2011-08-25 NOTE — ED Notes (Signed)
Pt states that her head is hurting where she has the rash. Pt noted to have red patches throughout scalp with pustule in some areas as well. Pt states that her pain on RLQ has easied some. Pt states that she feels a "ball" in that area when she stands but that she does not feel it when she stands. No palpable mass noted in RLQ.

## 2011-08-25 NOTE — ED Notes (Signed)
Report received from Jenni, RN 

## 2011-08-25 NOTE — ED Notes (Signed)
Multiple complaints. Has rash to scalp that has become painful, causing facial swelling around her eyes, airway is intact, now having right side abd pain. No acute distress noted at triage.

## 2011-08-25 NOTE — ED Notes (Signed)
NP at bedside.

## 2011-08-25 NOTE — ED Provider Notes (Signed)
History     CSN: [id number]  Arrival date & time 08/25/11  1111   First MD Initiated Contact with Patient 08/25/11 1235      Chief Complaint  Patient presents with  . Rash  . Facial Swelling  . Abdominal Pain    (Consider location/radiation/quality/duration/timing/severity/associated sxs/prior treatment) HPI Patient presents to the emergency department with a rash in her scalp that is now on her upper forehead.  This rash has been present for greater than 6 months.  Patient also has a complaint of right-sided abdominal pain, mainly in the right upper quadrant that began a week ago.  She denies nausea, vomiting, fever, dizziness, chest pain, shortness of breath, or diarrhea.  Patient also is complaining of swelling around her eyes which she feels is associated with her rash. History reviewed. No pertinent past medical history.  History reviewed. No pertinent past surgical history.  History reviewed. No pertinent family history.  History  Substance Use Topics  . Smoking status: Not on file  . Smokeless tobacco: Not on file  . Alcohol Use: No    OB History    Grav Para Term Preterm Abortions TAB SAB Ect Mult Living                  Review of Systems All other systems negative except as documented in the HPI. All pertinent positives and negatives as reviewed in the HPI.  Allergies  Ibuprofen  Home Medications   Current Outpatient Rx  Name Route Sig Dispense Refill  . CLOBETASOL PROPIONATE 0.05 % EX SOLN Topical Apply 1 application topically daily. After shower    . DOXYCYCLINE HYCLATE 100 MG PO TABS Oral Take 100 mg by mouth 2 (two) times daily.      BP 111/71  Pulse 65  Temp(Src) 98.1 F (36.7 C) (Oral)  Resp 18  SpO2 99%  Physical Exam  Constitutional: She appears well-developed and well-nourished. No distress.  HENT:  Head: Normocephalic and atraumatic.  Cardiovascular: Normal rate, regular rhythm and normal heart sounds.  Exam reveals no gallop and no  friction rub.   No murmur heard. Pulmonary/Chest: Effort normal and breath sounds normal.  Abdominal: Normal appearance. There is tenderness in the right upper quadrant. There is no rebound and no guarding. No hernia.    ED Course  Procedures (including critical care time)  Labs Reviewed  CBC - Abnormal; Notable for the following:    WBC 11.4 (*)    All other components within normal limits  URINALYSIS, ROUTINE W REFLEX MICROSCOPIC - Abnormal; Notable for the following:    APPearance CLOUDY (*)    Leukocytes, UA MODERATE (*)    All other components within normal limits  URINE MICROSCOPIC-ADD ON - Abnormal; Notable for the following:    Squamous Epithelial / LPF MANY (*)    Bacteria, UA FEW (*)    All other components within normal limits  DIFFERENTIAL  COMPREHENSIVE METABOLIC PANEL  LIPASE, BLOOD   US Abdomen Complete  08/25/2011  *RADIOLOGY REPORT*  Clinical Data:  Right upper quadrant and right mid abdominal pain.  COMPLETE ABDOMINAL ULTRASOUND  Comparison:  None.  Findings:  Gallbladder:  Removed.  Common bile duct:  Normal.  5.6 mm in diameter.  Liver:  Normal.  IVC:  Normal.  Pancreas:  Normal.  Spleen:  Normal.  6.4 cm in length.  Right Kidney:  Normal.  10.6 cm in length.  Left Kidney:  Normal.  10.5 cm in length.  Abdominal aorta:  Normal.  1.9 cm in diameter.  IMPRESSION: The gallbladder has been removed.  Otherwise, normal exam.  Original Report Authenticated By: Gwynn Burly, M.D.    Patient admitted to the CDU awaiting CT scan results.  Patient will need to be referred back to dermatology for her scalp issues. The patient is advised of the findings.    MDM  MDM Reviewed: nursing note and vitals Interpretation: labs, CT scan and ultrasound            Carlyle Dolly, PA-C 08/26/11 [phone]

## 2011-08-25 NOTE — ED Notes (Signed)
Pt presented to the ER with c/o rash to her scalp x 6 months and has gotten progressively worse. Pt was seen at the clinic and was given po antibiotics and was instructed to change her shampoo. The swelling to her face started yesterday.

## 2011-08-25 NOTE — ED Provider Notes (Signed)
Medical screening examination/treatment/procedure(s) were performed by non-physician practitioner and as supervising physician I was immediately available for consultation/collaboration.  Thurlow Gallaga L Brodin Gelpi, MD 08/25/11 2319 

## 2011-08-26 LAB — URINE CULTURE: Colony Count: 2000

## 2011-08-26 NOTE — ED Provider Notes (Signed)
Medical screening examination/treatment/procedure(s) were performed by non-physician practitioner and as supervising physician I was immediately available for consultation/collaboration.  Jeter Tomey K Linker, MD 08/26/11 0706 

## 2011-09-26 ENCOUNTER — Encounter (INDEPENDENT_AMBULATORY_CARE_PROVIDER_SITE_OTHER): Payer: Self-pay

## 2011-09-26 ENCOUNTER — Ambulatory Visit (INDEPENDENT_AMBULATORY_CARE_PROVIDER_SITE_OTHER): Payer: Self-pay | Admitting: General Surgery

## 2011-10-04 ENCOUNTER — Encounter (INDEPENDENT_AMBULATORY_CARE_PROVIDER_SITE_OTHER): Payer: Self-pay | Admitting: General Surgery

## 2012-03-13 ENCOUNTER — Ambulatory Visit (HOSPITAL_COMMUNITY): Payer: Self-pay

## 2012-05-02 ENCOUNTER — Other Ambulatory Visit: Payer: Self-pay | Admitting: Obstetrics and Gynecology

## 2012-05-02 DIAGNOSIS — Z1231 Encounter for screening mammogram for malignant neoplasm of breast: Secondary | ICD-10-CM

## 2012-05-09 ENCOUNTER — Encounter (HOSPITAL_COMMUNITY): Payer: Self-pay | Admitting: *Deleted

## 2012-05-15 ENCOUNTER — Ambulatory Visit (HOSPITAL_COMMUNITY): Payer: Self-pay | Attending: Obstetrics and Gynecology

## 2012-05-15 ENCOUNTER — Ambulatory Visit (HOSPITAL_COMMUNITY): Payer: Self-pay

## 2012-09-12 ENCOUNTER — Other Ambulatory Visit (HOSPITAL_COMMUNITY): Payer: Self-pay | Admitting: Family Medicine

## 2012-09-12 DIAGNOSIS — Z1231 Encounter for screening mammogram for malignant neoplasm of breast: Secondary | ICD-10-CM

## 2012-09-20 ENCOUNTER — Ambulatory Visit (HOSPITAL_COMMUNITY)
Admission: RE | Admit: 2012-09-20 | Discharge: 2012-09-20 | Disposition: A | Discharge status: Home / Self Care | Payer: Self-pay | Source: Ambulatory Visit | Attending: Family Medicine | Admitting: Family Medicine

## 2012-09-20 DIAGNOSIS — Z1231 Encounter for screening mammogram for malignant neoplasm of breast: Secondary | ICD-10-CM | POA: Insufficient documentation

## 2013-02-08 ENCOUNTER — Emergency Department (HOSPITAL_COMMUNITY)
Admission: EM | Admit: 2013-02-08 | Discharge: 2013-02-09 | Disposition: A | Discharge status: Home / Self Care | Payer: Medicaid Other | Attending: Emergency Medicine | Admitting: Emergency Medicine

## 2013-02-08 ENCOUNTER — Encounter (HOSPITAL_COMMUNITY): Payer: Self-pay | Admitting: Emergency Medicine

## 2013-02-08 DIAGNOSIS — IMO0002 Reserved for concepts with insufficient information to code with codable children: Secondary | ICD-10-CM | POA: Insufficient documentation

## 2013-02-08 DIAGNOSIS — Y92410 Unspecified street and highway as the place of occurrence of the external cause: Secondary | ICD-10-CM | POA: Diagnosis not present

## 2013-02-08 DIAGNOSIS — Z79899 Other long term (current) drug therapy: Secondary | ICD-10-CM | POA: Diagnosis not present

## 2013-02-08 DIAGNOSIS — Z87448 Personal history of other diseases of urinary system: Secondary | ICD-10-CM | POA: Insufficient documentation

## 2013-02-08 DIAGNOSIS — R0602 Shortness of breath: Secondary | ICD-10-CM | POA: Insufficient documentation

## 2013-02-08 DIAGNOSIS — S0993XA Unspecified injury of face, initial encounter: Secondary | ICD-10-CM | POA: Insufficient documentation

## 2013-02-08 DIAGNOSIS — Z8619 Personal history of other infectious and parasitic diseases: Secondary | ICD-10-CM | POA: Insufficient documentation

## 2013-02-08 DIAGNOSIS — R0789 Other chest pain: Secondary | ICD-10-CM

## 2013-02-08 DIAGNOSIS — Z872 Personal history of diseases of the skin and subcutaneous tissue: Secondary | ICD-10-CM | POA: Diagnosis not present

## 2013-02-08 DIAGNOSIS — Y9389 Activity, other specified: Secondary | ICD-10-CM | POA: Insufficient documentation

## 2013-02-08 DIAGNOSIS — Z862 Personal history of diseases of the blood and blood-forming organs and certain disorders involving the immune mechanism: Secondary | ICD-10-CM | POA: Insufficient documentation

## 2013-02-08 DIAGNOSIS — S0990XA Unspecified injury of head, initial encounter: Secondary | ICD-10-CM | POA: Insufficient documentation

## 2013-02-08 DIAGNOSIS — S298XXA Other specified injuries of thorax, initial encounter: Secondary | ICD-10-CM | POA: Insufficient documentation

## 2013-02-08 DIAGNOSIS — Z8719 Personal history of other diseases of the digestive system: Secondary | ICD-10-CM | POA: Diagnosis not present

## 2013-02-08 DIAGNOSIS — Z8639 Personal history of other endocrine, nutritional and metabolic disease: Secondary | ICD-10-CM | POA: Insufficient documentation

## 2013-02-08 MED ORDER — MORPHINE SULFATE 4 MG/ML IJ SOLN
4.0000 mg | Freq: Once | INTRAMUSCULAR | Status: AC
  Administered 2013-02-09: 4 mg via INTRAVENOUS
  Filled 2013-02-08: qty 1

## 2013-02-08 MED ORDER — ONDANSETRON HCL 4 MG/2ML IJ SOLN
4.0000 mg | Freq: Once | INTRAMUSCULAR | Status: AC
  Administered 2013-02-09: 4 mg via INTRAVENOUS
  Filled 2013-02-08: qty 2
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
## 2013-02-09 ENCOUNTER — Emergency Department (HOSPITAL_COMMUNITY): Payer: Medicaid Other

## 2013-02-09 ENCOUNTER — Encounter (HOSPITAL_COMMUNITY): Payer: Self-pay | Admitting: Radiology

## 2013-02-09 LAB — CBC
MCH: 29.2 pg (ref 26.0–34.0)
Platelets: 228 10*3/uL (ref 150–400)
RBC: 4.83 MIL/uL (ref 3.87–5.11)
RDW: 13.3 % (ref 11.5–15.5)
WBC: 12.2 10*3/uL — ABNORMAL HIGH (ref 4.0–10.5)

## 2013-02-09 LAB — POCT I-STAT TROPONIN I: Troponin i, poc: 0.02 ng/mL (ref 0.00–0.08)

## 2013-02-09 LAB — PRO B NATRIURETIC PEPTIDE: Pro B Natriuretic peptide (BNP): 32.8 pg/mL (ref 0–125)

## 2013-02-09 LAB — POCT I-STAT, CHEM 8
BUN: 11 mg/dL (ref 6–23)
HCT: 44 % (ref 36.0–46.0)
Sodium: 142 mEq/L (ref 135–145)
TCO2: 25 mmol/L (ref 0–100)

## 2013-02-09 MED ORDER — MORPHINE SULFATE 4 MG/ML IJ SOLN: 4.0000 mg | Freq: Once | INTRAMUSCULAR | Status: DC

## 2013-02-09 MED ORDER — DIAZEPAM 5 MG PO TABS: 5.0000 mg | ORAL_TABLET | Freq: Three times a day (TID) | ORAL | Status: DC | PRN

## 2013-02-09 MED ORDER — IOHEXOL 300 MG/ML  SOLN
75.0000 mL | Freq: Once | INTRAMUSCULAR | Status: AC | PRN
  Administered 2013-02-09: 75 mL via INTRAVENOUS

## 2013-02-09 MED ORDER — HYDROCODONE-ACETAMINOPHEN 5-325 MG PO TABS: 1.0000 | ORAL_TABLET | ORAL | Status: DC | PRN

## 2013-02-09 NOTE — ED Notes (Signed)
Pt back from radiology 
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
## 2013-02-09 NOTE — ED Provider Notes (Signed)
Medical screening examination/treatment/procedure(s) were performed by non-physician practitioner and as supervising physician I was immediately available for consultation/collaboration.     Brandt Loosen, MD 02/09/13 [phone]

## 2013-02-09 NOTE — ED Notes (Signed)
Pt taken to radiology

## 2016-10-08 LAB — GLUCOSE, POCT (MANUAL RESULT ENTRY): POC Glucose: 103 mg/dl — AB (ref 70–99)

## 2016-12-24 ENCOUNTER — Emergency Department (HOSPITAL_COMMUNITY): Payer: No Typology Code available for payment source

## 2016-12-24 ENCOUNTER — Emergency Department (HOSPITAL_COMMUNITY)
Admission: EM | Admit: 2016-12-24 | Discharge: 2016-12-24 | Disposition: A | Discharge status: Home / Self Care | Payer: No Typology Code available for payment source | Attending: Emergency Medicine | Admitting: Emergency Medicine

## 2016-12-24 ENCOUNTER — Encounter (HOSPITAL_COMMUNITY): Payer: Self-pay | Admitting: Emergency Medicine

## 2016-12-24 DIAGNOSIS — R079 Chest pain, unspecified: Secondary | ICD-10-CM | POA: Insufficient documentation

## 2016-12-24 DIAGNOSIS — Z23 Encounter for immunization: Secondary | ICD-10-CM | POA: Diagnosis not present

## 2016-12-24 DIAGNOSIS — S42001A Fracture of unspecified part of right clavicle, initial encounter for closed fracture: Secondary | ICD-10-CM | POA: Diagnosis not present

## 2016-12-24 DIAGNOSIS — Z79899 Other long term (current) drug therapy: Secondary | ICD-10-CM | POA: Diagnosis not present

## 2016-12-24 DIAGNOSIS — S0101XA Laceration without foreign body of scalp, initial encounter: Secondary | ICD-10-CM | POA: Insufficient documentation

## 2016-12-24 DIAGNOSIS — Y9389 Activity, other specified: Secondary | ICD-10-CM | POA: Insufficient documentation

## 2016-12-24 DIAGNOSIS — S0990XA Unspecified injury of head, initial encounter: Secondary | ICD-10-CM | POA: Diagnosis present

## 2016-12-24 DIAGNOSIS — Y92410 Unspecified street and highway as the place of occurrence of the external cause: Secondary | ICD-10-CM | POA: Diagnosis not present

## 2016-12-24 DIAGNOSIS — R109 Unspecified abdominal pain: Secondary | ICD-10-CM | POA: Insufficient documentation

## 2016-12-24 DIAGNOSIS — Y999 Unspecified external cause status: Secondary | ICD-10-CM | POA: Insufficient documentation

## 2016-12-24 DIAGNOSIS — T1490XA Injury, unspecified, initial encounter: Secondary | ICD-10-CM

## 2016-12-24 DIAGNOSIS — R911 Solitary pulmonary nodule: Secondary | ICD-10-CM

## 2016-12-24 LAB — I-STAT CHEM 8, ED
BUN: 14 mg/dL (ref 6–20)
BUN: 14 mg/dL (ref 6–20)
CALCIUM ION: 1.13 mmol/L — AB (ref 1.15–1.40)
CHLORIDE: 105 mmol/L (ref 101–111)
Calcium, Ion: 1.07 mmol/L — ABNORMAL LOW (ref 1.15–1.40)
Chloride: 105 mmol/L (ref 101–111)
Creatinine, Ser: 0.9 mg/dL (ref 0.44–1.00)
Creatinine, Ser: 0.7 mg/dL (ref 0.44–1.00)
Glucose, Bld: 150 mg/dL — ABNORMAL HIGH (ref 65–99)
Glucose, Bld: 143 mg/dL — ABNORMAL HIGH (ref 65–99)
HEMATOCRIT: 38 % (ref 36.0–46.0)
HEMATOCRIT: 34 % — AB (ref 36.0–46.0)
Hemoglobin: 12.9 g/dL (ref 12.0–15.0)
Hemoglobin: 11.6 g/dL — ABNORMAL LOW (ref 12.0–15.0)
POTASSIUM: 3.2 mmol/L — AB (ref 3.5–5.1)
Potassium: 4.3 mmol/L (ref 3.5–5.1)
SODIUM: 139 mmol/L (ref 135–145)
SODIUM: 140 mmol/L (ref 135–145)
TCO2: 23 mmol/L (ref 22–32)
TCO2: 27 mmol/L (ref 22–32)

## 2016-12-24 LAB — CBC
HEMATOCRIT: 37.3 % (ref 36.0–46.0)
Hemoglobin: 12.3 g/dL (ref 12.0–15.0)
MCH: 28.7 pg (ref 26.0–34.0)
MCHC: 33 g/dL (ref 30.0–36.0)
MCV: 86.9 fL (ref 78.0–100.0)
Platelets: 217 10*3/uL (ref 150–400)
RBC: 4.29 MIL/uL (ref 3.87–5.11)
RDW: 13.4 % (ref 11.5–15.5)
WBC: 14.5 10*3/uL — AB (ref 4.0–10.5)

## 2016-12-24 LAB — PROTIME-INR
INR: 1.1
PROTHROMBIN TIME: 14.1 s (ref 11.4–15.2)

## 2016-12-24 LAB — TYPE AND SCREEN
ABO/RH(D): O POS
Antibody Screen: NEGATIVE

## 2016-12-24 LAB — URINALYSIS, ROUTINE W REFLEX MICROSCOPIC
Bilirubin Urine: NEGATIVE
GLUCOSE, UA: NEGATIVE mg/dL
KETONES UR: NEGATIVE mg/dL
NITRITE: POSITIVE — AB
PROTEIN: NEGATIVE mg/dL
Specific Gravity, Urine: 1.03 (ref 1.005–1.030)
pH: 5 (ref 5.0–8.0)

## 2016-12-24 LAB — I-STAT CG4 LACTIC ACID, ED: Lactic Acid, Venous: 2.04 mmol/L (ref 0.5–1.9)

## 2016-12-24 LAB — ETHANOL: Alcohol, Ethyl (B): <10 mg/dL (ref <10)

## 2016-12-24 LAB — COMPREHENSIVE METABOLIC PANEL
ALBUMIN: 3.5 g/dL (ref 3.5–5.0)
ALT: 28 U/L (ref 14–54)
AST: 34 U/L (ref 15–41)
Alkaline Phosphatase: 72 U/L (ref 38–126)
Anion gap: 8 (ref 5–15)
BUN: 13 mg/dL (ref 6–20)
CHLORIDE: 106 mmol/L (ref 101–111)
CO2: 22 mmol/L (ref 22–32)
Calcium: 8.8 mg/dL — ABNORMAL LOW (ref 8.9–10.3)
Creatinine, Ser: 0.99 mg/dL (ref 0.44–1.00)
GFR calc Af Amer: >60 mL/min (ref >60)
GLUCOSE: 155 mg/dL — AB (ref 65–99)
POTASSIUM: 3.3 mmol/L — AB (ref 3.5–5.1)
SODIUM: 136 mmol/L (ref 135–145)
Total Bilirubin: 0.7 mg/dL (ref 0.3–1.2)
Total Protein: 6.5 g/dL (ref 6.5–8.1)

## 2016-12-24 LAB — I-STAT BETA HCG BLOOD, ED (MC, WL, AP ONLY): I-stat hCG, quantitative: <5 m[IU]/mL (ref <5)

## 2016-12-24 LAB — ABO/RH: ABO/RH(D): O POS

## 2016-12-24 LAB — CDS SEROLOGY

## 2016-12-24 MED ORDER — FENTANYL CITRATE (PF) 100 MCG/2ML IJ SOLN: 50.0000 ug | Freq: Once | INTRAMUSCULAR | Status: DC

## 2016-12-24 MED ORDER — HYDROCODONE-ACETAMINOPHEN 5-325 MG PO TABS
1.0000 | ORAL_TABLET | Freq: Once | ORAL | Status: AC
  Administered 2016-12-24: 1 via ORAL
  Filled 2016-12-24: qty 1

## 2016-12-24 MED ORDER — ONDANSETRON HCL 4 MG/2ML IJ SOLN
INTRAMUSCULAR | Status: AC
  Filled 2016-12-24: qty 2

## 2016-12-24 MED ORDER — HYDROCODONE-ACETAMINOPHEN 5-325 MG PO TABS: 2.0000 | ORAL_TABLET | ORAL | 0 refills | Status: DC | PRN

## 2016-12-24 MED ORDER — IOPAMIDOL (ISOVUE-300) INJECTION 61%
INTRAVENOUS | Status: AC
  Administered 2016-12-24: 100 mL
  Filled 2016-12-24: qty 100

## 2016-12-24 MED ORDER — FENTANYL CITRATE (PF) 100 MCG/2ML IJ SOLN
50.0000 ug | Freq: Once | INTRAMUSCULAR | Status: AC
  Administered 2016-12-24: 50 ug via INTRAVENOUS
  Filled 2016-12-24: qty 2

## 2016-12-24 MED ORDER — CEPHALEXIN 500 MG PO CAPS: 500.0000 mg | ORAL_CAPSULE | Freq: Four times a day (QID) | ORAL | 0 refills | Status: DC

## 2016-12-24 MED ORDER — TETANUS-DIPHTH-ACELL PERTUSSIS 5-2.5-18.5 LF-MCG/0.5 IM SUSP
0.5000 mL | Freq: Once | INTRAMUSCULAR | Status: AC
  Administered 2016-12-24: 0.5 mL via INTRAMUSCULAR
  Filled 2016-12-24: qty 0.5
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
## 2016-12-29 ENCOUNTER — Encounter: Payer: Self-pay | Admitting: Family Medicine

## 2016-12-29 ENCOUNTER — Ambulatory Visit (INDEPENDENT_AMBULATORY_CARE_PROVIDER_SITE_OTHER): Payer: Self-pay | Admitting: Family Medicine

## 2016-12-29 VITALS — BP 108/72 | HR 74 | Temp 98.6°F | Resp 18 | Ht 60.0 in | Wt 133.8 lb

## 2016-12-29 DIAGNOSIS — S42001D Fracture of unspecified part of right clavicle, subsequent encounter for fracture with routine healing: Secondary | ICD-10-CM

## 2016-12-29 DIAGNOSIS — S0101XD Laceration without foreign body of scalp, subsequent encounter: Secondary | ICD-10-CM

## 2016-12-29 MED ORDER — HYDROCODONE-ACETAMINOPHEN 5-325 MG PO TABS: 2.0000 | ORAL_TABLET | ORAL | 0 refills | Status: DC | PRN

## 2016-12-29 NOTE — Patient Instructions (Addendum)
1. LLamar al ortopeda para sacar una cita. 2. Regresar en 2 dias para evaluar los puntos y grapas    IF you received an x-ray today, you will receive an invoice from [facility]. Please contact [facility] at [phone] with questions or concerns regarding your invoice.   IF you received labwork today, you will receive an invoice from [facility]. Please contact [facility] at [phone] with questions or concerns regarding your invoice.   Our billing staff will not be able to assist you with questions regarding bills from these companies.  You will be contacted with the lab results as soon as they are available. The fastest way to get your results is to activate your My Chart account. Instructions are located on the last page of this paperwork. If you have not heard from [place] regarding the results in 2 weeks, please contact this office.
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
## 2017-01-02 ENCOUNTER — Ambulatory Visit (INDEPENDENT_AMBULATORY_CARE_PROVIDER_SITE_OTHER): Payer: Self-pay | Admitting: Family Medicine

## 2017-01-02 ENCOUNTER — Ambulatory Visit: Payer: Self-pay | Admitting: Family Medicine

## 2017-01-02 ENCOUNTER — Encounter: Payer: Self-pay | Admitting: Family Medicine

## 2017-01-02 VITALS — BP 134/72 | HR 70 | Temp 98.8°F | Resp 18 | Ht 60.0 in | Wt 133.6 lb

## 2017-01-02 DIAGNOSIS — S0101XD Laceration without foreign body of scalp, subsequent encounter: Secondary | ICD-10-CM

## 2017-01-02 NOTE — Patient Instructions (Signed)
     IF you received an x-ray today, you will receive an invoice from [facility]. Please contact [facility] at [phone] with questions or concerns regarding your invoice.   IF you received labwork today, you will receive an invoice from [facility]. Please contact [facility] at [phone] with questions or concerns regarding your invoice.   Our billing staff will not be able to assist you with questions regarding bills from these companies.  You will be contacted with the lab results as soon as they are available. The fastest way to get your results is to activate your My Chart account. Instructions are located on the last page of this paperwork. If you have not heard from us regarding the results in 2 weeks, please contact this office.     
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
## 2017-05-22 ENCOUNTER — Other Ambulatory Visit: Payer: Self-pay

## 2017-05-24 LAB — CYTOLOGY - PAP: Diagnosis: NEGATIVE

## 2017-07-22 ENCOUNTER — Other Ambulatory Visit: Payer: Self-pay

## 2017-07-22 ENCOUNTER — Encounter (HOSPITAL_COMMUNITY): Payer: Self-pay

## 2017-07-22 DIAGNOSIS — Z79899 Other long term (current) drug therapy: Secondary | ICD-10-CM | POA: Diagnosis not present

## 2017-07-22 DIAGNOSIS — S161XXA Strain of muscle, fascia and tendon at neck level, initial encounter: Secondary | ICD-10-CM | POA: Diagnosis not present

## 2017-07-22 DIAGNOSIS — Y939 Activity, unspecified: Secondary | ICD-10-CM | POA: Insufficient documentation

## 2017-07-22 DIAGNOSIS — Y929 Unspecified place or not applicable: Secondary | ICD-10-CM | POA: Diagnosis not present

## 2017-07-22 DIAGNOSIS — Y999 Unspecified external cause status: Secondary | ICD-10-CM | POA: Diagnosis not present

## 2017-07-22 DIAGNOSIS — S39012A Strain of muscle, fascia and tendon of lower back, initial encounter: Secondary | ICD-10-CM | POA: Diagnosis not present

## 2017-07-22 DIAGNOSIS — S199XXA Unspecified injury of neck, initial encounter: Secondary | ICD-10-CM | POA: Diagnosis present
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
## 2017-07-23 ENCOUNTER — Emergency Department (HOSPITAL_COMMUNITY)
Admission: EM | Admit: 2017-07-23 | Discharge: 2017-07-23 | Disposition: A | Discharge status: Home / Self Care | Payer: No Typology Code available for payment source | Attending: Emergency Medicine | Admitting: Emergency Medicine

## 2017-07-23 DIAGNOSIS — S161XXA Strain of muscle, fascia and tendon at neck level, initial encounter: Secondary | ICD-10-CM

## 2017-07-23 DIAGNOSIS — S39012A Strain of muscle, fascia and tendon of lower back, initial encounter: Secondary | ICD-10-CM

## 2017-07-23 MED ORDER — METHOCARBAMOL 500 MG PO TABS: 500.0000 mg | ORAL_TABLET | Freq: Two times a day (BID) | ORAL | 0 refills | Status: DC

## 2017-07-23 MED ORDER — METHOCARBAMOL 500 MG PO TABS
500.0000 mg | ORAL_TABLET | Freq: Once | ORAL | Status: AC
  Administered 2017-07-23: 500 mg via ORAL
  Filled 2017-07-23: qty 1
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
## 2017-07-23 NOTE — Discharge Instructions (Addendum)
1. Medications: robaxin, usual home medications °2. Treatment: rest, drink plenty of fluids, gentle stretching as discussed, alternate ice and heat °3. Follow Up: Please followup with your primary doctor in 3 days for discussion of your diagnoses and further evaluation after today's visit; if you do not have a primary care doctor use the resource guide provided to find one;  Return to the ER for worsening back pain, difficulty walking, loss of bowel or bladder control or other concerning symptoms ° ° ° °
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
## 2019-07-19 ENCOUNTER — Ambulatory Visit: Payer: Self-pay | Attending: Internal Medicine

## 2019-07-19 DIAGNOSIS — Z23 Encounter for immunization: Secondary | ICD-10-CM

## 2019-07-19 NOTE — Progress Notes (Signed)
   Covid-19 Vaccination Clinic  Name:  Brooke Ray    MRN: [id number] DOB: 02/28/1961  07/19/2019  Ms. Brooke Ray was observed post Covid-19 immunization for 15 minutes without incident. She was provided with Vaccine Information Sheet and instruction to access the V-Safe system.   Ms. Brooke Ray was instructed to call 911 with any severe reactions post vaccine: Marland Kitchen Difficulty breathing  . Swelling of face and throat  . A fast heartbeat  . A bad rash all over body  . Dizziness and weakness   Immunizations Administered    Name Date Dose VIS Date Route   Pfizer COVID-19 Vaccine 07/19/2019  5:15 PM 0.3 mL 05/22/2018 Intramuscular   Manufacturer: [employer], [employer]   Lot: [id number]   NDC: [address]-7

## 2019-08-12 ENCOUNTER — Ambulatory Visit: Payer: Self-pay | Attending: Internal Medicine

## 2019-08-12 DIAGNOSIS — Z23 Encounter for immunization: Secondary | ICD-10-CM

## 2019-08-12 NOTE — Progress Notes (Signed)
   Covid-19 Vaccination Clinic  Name:  Brooke Ray    MRN: [id number] DOB: 02/27/1961  08/12/2019  Brooke Ray was observed post Covid-19 immunization for 15 minutes without incident. She was provided with Vaccine Information Sheet and instruction to access the V-Safe system.   Brooke Ray was instructed to call 911 with any severe reactions post vaccine: Marland Kitchen Difficulty breathing  . Swelling of face and throat  . A fast heartbeat  . A bad rash all over body  . Dizziness and weakness   Immunizations Administered    Name Date Dose VIS Date Route   Pfizer COVID-19 Vaccine 08/12/2019  5:17 PM 0.3 mL 05/22/2018 Intramuscular   Manufacturer: [employer], [employer]   Lot: [id number]   NDC: [address]-7

## 2019-09-02 ENCOUNTER — Ambulatory Visit (HOSPITAL_COMMUNITY): Payer: Self-pay | Admitting: Clinical

## 2019-09-09 ENCOUNTER — Ambulatory Visit (HOSPITAL_COMMUNITY): Payer: Self-pay | Admitting: Clinical

## 2021-08-15 ENCOUNTER — Encounter (HOSPITAL_COMMUNITY): Payer: Self-pay | Admitting: Emergency Medicine

## 2021-08-15 ENCOUNTER — Emergency Department (HOSPITAL_COMMUNITY)
Admission: EM | Admit: 2021-08-15 | Discharge: 2021-08-15 | Disposition: A | Discharge status: Home / Self Care | Payer: Self-pay | Attending: Emergency Medicine | Admitting: Emergency Medicine

## 2021-08-15 ENCOUNTER — Emergency Department (HOSPITAL_COMMUNITY): Payer: Self-pay

## 2021-08-15 ENCOUNTER — Other Ambulatory Visit: Payer: Self-pay

## 2021-08-15 DIAGNOSIS — S0003XA Contusion of scalp, initial encounter: Secondary | ICD-10-CM | POA: Insufficient documentation

## 2021-08-15 DIAGNOSIS — X500XXA Overexertion from strenuous movement or load, initial encounter: Secondary | ICD-10-CM | POA: Insufficient documentation

## 2021-08-15 DIAGNOSIS — Y990 Civilian activity done for income or pay: Secondary | ICD-10-CM | POA: Insufficient documentation

## 2021-08-15 DIAGNOSIS — S0990XA Unspecified injury of head, initial encounter: Secondary | ICD-10-CM

## 2021-08-15 MED ORDER — ACETAMINOPHEN 500 MG PO TABS
1000.0000 mg | ORAL_TABLET | Freq: Once | ORAL | Status: AC
  Administered 2021-08-15: 1000 mg via ORAL
  Filled 2021-08-15: qty 2
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
## 2021-08-15 NOTE — Discharge Instructions (Addendum)
Return for any problem.  ?

## 2021-08-15 NOTE — ED Notes (Signed)
Patient given discharge instructions, all questions answered. Patient in possession of all belongings, directed to the discharge area  

## 2021-08-17 ENCOUNTER — Encounter (HOSPITAL_COMMUNITY): Payer: Self-pay | Admitting: Emergency Medicine

## 2021-08-17 ENCOUNTER — Ambulatory Visit (HOSPITAL_COMMUNITY)
Admission: EM | Admit: 2021-08-17 | Discharge: 2021-08-17 | Disposition: A | Discharge status: Home / Self Care | Payer: Self-pay | Attending: Family Medicine | Admitting: Family Medicine

## 2021-08-17 DIAGNOSIS — S060X9A Concussion with loss of consciousness of unspecified duration, initial encounter: Secondary | ICD-10-CM

## 2021-08-17 DIAGNOSIS — R42 Dizziness and giddiness: Secondary | ICD-10-CM

## 2021-08-17 MED ORDER — MECLIZINE HCL 25 MG PO TABS: 25.0000 mg | ORAL_TABLET | Freq: Three times a day (TID) | ORAL | 0 refills | Status: DC | PRN
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
## 2021-08-17 NOTE — ED Provider Notes (Signed)
[facility]    CSN: [id number] Arrival date & time: 08/17/21  0850      History   Chief Complaint Chief Complaint  Patient presents with   Dizziness    HPI Brooke Ray is a 61 y.o. female.    Dizziness Here for headache, vertigo, and feeling off.  On May 21 she was at work when a frozen ham fell off a shelf above her and hit her on her vertex.  It causes some pretty intense pain immediately.  She was seen in the emergency room and CT scan was performed and was negative for any problem  Yesterday May 22, she had vertigo started bothering her it happens when she rolls over in bed or looks to the side.  It is cause nausea but she has not had any vomiting.  No fever or diarrhea or URI symptoms.  She is also felt tired and not herself since this occurred.  She is not taking any anticoagulants.  She does not have a history of diabetes  Past Medical History:  Diagnosis Date   Hyperlipidemia    Infection of skin, local    Inguinal hernia bilateral, non-recurrent    Postmenopausal atrophic vaginitis    Renal disorder    dysuria    Patient Active Problem List   Diagnosis Date Noted   POSTMENOPAUSAL SYNDROME 03/10/2010   VAGINITIS, ATROPHIC 01/18/2010   DYSURIA 01/18/2010   CANDIDIASIS OF VULVA AND VAGINA 12/31/2009   CALLUS, FOOT 04/21/2009   NASOPHARYNGITIS 10/22/2008   KNEE PAIN, LEFT 10/22/2008   OTHER NONSPECIFIC ABNORMAL FINDING 06/12/2008   IBS 05/22/2007   BACK PAIN, LUMBAR 05/22/2007   ANXIETY STATE, UNSPECIFIED 05/22/2007   HYPERLIPIDEMIA 03/20/2007   CARPAL TUNNEL SYNDROME 12/11/2006    History reviewed. No pertinent surgical history.  OB History   No obstetric history on file.      Home Medications    Prior to Admission medications   Medication Sig Start Date End Date Taking? Authorizing Provider  meclizine (ANTIVERT) 25 MG tablet Take 1 tablet (25 mg total) by mouth 3 (three) times daily as needed for dizziness.  08/17/21  Yes Barrett Henle, MD  clobetasol (TEMOVATE) 0.05 % external solution Apply 1 application topically daily. After shower    [provider]  Levothyroxine Sodium (SYNTHROID PO) Take 1 tablet by mouth daily.    [provider]    Family History History reviewed. No pertinent family history.  Social History Social History   Tobacco Use   Smoking status: Never   Smokeless tobacco: Never  Substance Use Topics   Alcohol use: No   Drug use: No     Allergies   Ibuprofen   Review of Systems Review of Systems  Neurological:  Positive for dizziness.    Physical Exam Triage Vital Signs ED Triage Vitals [08/17/21 0942]  Enc Vitals Group     BP 121/82     Pulse Rate 62     Resp 16     Temp 98.1 F (36.7 C)     Temp Source Oral     SpO2 95 %     Weight      Height      Head Circumference      Peak Flow      Pain Score 8     Pain Loc      Pain Edu?      Excl. in [place]?    No data found.  Updated Vital Signs  BP 121/82 (BP Location: Left Arm)   Pulse 62   Temp 98.1 F (36.7 C) (Oral)   Resp 16   SpO2 95%   Visual Acuity Right Eye Distance:   Left Eye Distance:   Bilateral Distance:    Right Eye Near:   Left Eye Near:    Bilateral Near:     Physical Exam Vitals reviewed.  Constitutional:      General: She is not in acute distress.    Appearance: She is not toxic-appearing.  HENT:     Right Ear: Tympanic membrane and ear canal normal.     Left Ear: Tympanic membrane and ear canal normal.     Nose: Nose normal.     Mouth/Throat:     Mouth: Mucous membranes are moist.     Pharynx: No oropharyngeal exudate or posterior oropharyngeal erythema.  Eyes:     Conjunctiva/sclera: Conjunctivae normal.     Pupils: Pupils are equal, round, and reactive to light.     Comments: There is nystagmus on lateral gaze to both sides  Cardiovascular:     Rate and Rhythm: Normal rate and regular rhythm.     Heart sounds: No murmur  heard. Pulmonary:     Effort: Pulmonary effort is normal. No respiratory distress.     Breath sounds: No wheezing, rhonchi or rales.  Musculoskeletal:     Cervical back: Neck supple.  Lymphadenopathy:     Cervical: No cervical adenopathy.  Skin:    Capillary Refill: Capillary refill takes less than 2 seconds.     Coloration: Skin is not jaundiced or pale.  Neurological:     General: No focal deficit present.     Mental Status: She is alert and oriented to person, place, and time.     Cranial Nerves: No cranial nerve deficit.     Sensory: No sensory deficit.     Motor: No weakness.     Coordination: Coordination normal.     Gait: Gait normal.     Deep Tendon Reflexes: Reflexes normal.  Psychiatric:        Behavior: Behavior normal.     UC Treatments / Results  Labs (all labs ordered are listed, but only abnormal results are displayed) Labs Reviewed - No data to display  EKG   Radiology CT Head Wo Contrast  Result Date: 08/15/2021 CLINICAL DATA:  61 year old female with head injury and headache. EXAM: CT HEAD WITHOUT CONTRAST TECHNIQUE: Contiguous axial images were obtained from the base of the skull through the vertex without intravenous contrast. RADIATION DOSE REDUCTION: This exam was performed according to the departmental dose-optimization program which includes automated exposure control, adjustment of the mA and/or kV according to patient size and/or use of iterative reconstruction technique. COMPARISON:  01/20/2005 head CT FINDINGS: Brain: No evidence of acute infarction, hemorrhage, hydrocephalus, extra-axial collection or mass lesion/mass effect. Vascular: No hyperdense vessel or unexpected calcification. Skull: Normal. Negative for fracture or focal lesion. Sinuses/Orbits: No acute finding. Other: None. IMPRESSION: Unremarkable noncontrast head CT. Electronically Signed   By: Margarette Canada M.D.   On: 08/15/2021 12:11    Procedures Procedures (including critical care  time)  Medications Ordered in UC Medications - No data to display  Initial Impression / Assessment and Plan / UC Course  I have reviewed the triage vital signs and the nursing notes.  Pertinent labs & imaging results that were available during my care of the patient were reviewed by me and considered in my medical decision making (  see chart for details).     Most likely she is having concussion symptoms.  Also the vertigo may be apart from that since it is so typical for benign positional vertigo. Final Clinical Impressions(s) / UC Diagnoses   Final diagnoses:  Concussion with loss of consciousness, initial encounter  Vertigo     Discharge Instructions      Take meclizine 25 mg--1 tablet every 8 hours as needed for vertigo/dizziness.(1 tableta cada 8 horas si tiene mareo)  Rest and drink plenty of fluids([place], y toma suficiente agua/liquidos)  Extra strength Tylenol 500 mg--2 every 4-6 hours as needed for pain or fever(Tylenol/acetaminophen 500 mg--2 tabletas cada 4 horas si tiene dolor o calientura)     ED Prescriptions     Medication Sig Dispense Auth. Provider   meclizine (ANTIVERT) 25 MG tablet Take 1 tablet (25 mg total) by mouth 3 (three) times daily as needed for dizziness. 30 tablet Loida Calamia, Gwenlyn Perking, MD      PDMP not reviewed this encounter.   Barrett Henle, MD 08/17/21 1040

## 2021-08-17 NOTE — Discharge Instructions (Addendum)
Take meclizine 25 mg--1 tablet every 8 hours as needed for vertigo/dizziness.(1 tableta cada 8 horas si tiene mareo)  Rest and drink plenty of fluids([place], y toma suficiente agua/liquidos)  Extra strength Tylenol 500 mg--2 every 4-6 hours as needed for pain or fever(Tylenol/acetaminophen 500 mg--2 tabletas cada 4 horas si tiene dolor o calientura)

## 2022-08-04 ENCOUNTER — Encounter (HOSPITAL_COMMUNITY): Payer: Self-pay | Admitting: *Deleted

## 2022-08-04 ENCOUNTER — Ambulatory Visit (HOSPITAL_COMMUNITY)
Admission: EM | Admit: 2022-08-04 | Discharge: 2022-08-04 | Disposition: A | Discharge status: Home / Self Care | Payer: 59 | Attending: Emergency Medicine | Admitting: Emergency Medicine

## 2022-08-04 DIAGNOSIS — L299 Pruritus, unspecified: Secondary | ICD-10-CM | POA: Diagnosis not present

## 2022-08-04 MED ORDER — DEXAMETHASONE SODIUM PHOSPHATE 10 MG/ML IJ SOLN
INTRAMUSCULAR | Status: AC
  Filled 2022-08-04: qty 1

## 2022-08-04 MED ORDER — DIPHENHYDRAMINE HCL 25 MG PO TABS: 25.0000 mg | ORAL_TABLET | Freq: Every evening | ORAL | 0 refills | Status: AC

## 2022-08-04 MED ORDER — DEXAMETHASONE SODIUM PHOSPHATE 10 MG/ML IJ SOLN
10.0000 mg | Freq: Once | INTRAMUSCULAR | Status: AC
  Administered 2022-08-04: 10 mg via INTRAMUSCULAR

## 2022-08-04 MED ORDER — CETIRIZINE HCL 10 MG PO TABS: 10.0000 mg | ORAL_TABLET | Freq: Every day | ORAL | 2 refills | Status: AC

## 2022-08-04 NOTE — ED Provider Notes (Signed)
[facility]    CSN: [id number] Arrival date & time: 08/04/22  4098      History   Chief Complaint Chief Complaint  Patient presents with   Pruritis    HPI Brooke Ray is a 62 y.o. female.  Medical interpreter used for this encounter Presents with 1 to 2-week history of itching all over her body.  She has not seen any rash or lesions but feels itching under the skin.  When she scratches at the skin it starts to burn.  She tried applying alcohol and a cream without relief No history of this Denies any shortness of breath, trouble breathing, swelling of lips or tongue No known exposures to allergens.  No new medications or products   Past Medical History:  Diagnosis Date   Hyperlipidemia    Infection of skin, local    Inguinal hernia bilateral, non-recurrent    Postmenopausal atrophic vaginitis    Renal disorder    dysuria    Patient Active Problem List   Diagnosis Date Noted   POSTMENOPAUSAL SYNDROME 03/10/2010   VAGINITIS, ATROPHIC 01/18/2010   DYSURIA 01/18/2010   CANDIDIASIS OF VULVA AND VAGINA 12/31/2009   CALLUS, FOOT 04/21/2009   NASOPHARYNGITIS 10/22/2008   KNEE PAIN, LEFT 10/22/2008   OTHER NONSPECIFIC ABNORMAL FINDING 06/12/2008   IBS 05/22/2007   BACK PAIN, LUMBAR 05/22/2007   ANXIETY STATE, UNSPECIFIED 05/22/2007   HYPERLIPIDEMIA 03/20/2007   CARPAL TUNNEL SYNDROME 12/11/2006    History reviewed. No pertinent surgical history.  OB History   No obstetric history on file.      Home Medications    Prior to Admission medications   Medication Sig Start Date End Date Taking? Authorizing Provider  cetirizine (ZYRTEC ALLERGY) 10 MG tablet Take 1 tablet (10 mg total) by mouth daily. Una tableta en la manana/dia 08/04/22  Yes Fischer Halley, Lurena Joiner, PA-C  diphenhydrAMINE (BENADRYL) 25 MG tablet Take 1 tablet (25 mg total) by mouth at bedtime. Una tableta en la noche 08/04/22  Yes Aleeya Veitch, Ray Church    Family History History  reviewed. No pertinent family history.  Social History Social History   Tobacco Use   Smoking status: Never   Smokeless tobacco: Never  Vaping Use   Vaping Use: Never used  Substance Use Topics   Alcohol use: No   Drug use: No     Allergies   Ibuprofen   Review of Systems Review of Systems As per HPI  Physical Exam Triage Vital Signs ED Triage Vitals  Enc Vitals Group     BP 08/04/22 1002 (!) 167/95     Pulse Rate 08/04/22 1002 60     Resp 08/04/22 1002 18     Temp 08/04/22 1002 97.9 F (36.6 C)     Temp Source 08/04/22 1002 Oral     SpO2 08/04/22 1002 96 %     Weight --      Height --      Head Circumference --      Peak Flow --      Pain Score 08/04/22 0959 10     Pain Loc --      Pain Edu? --      Excl. in [place]? --    No data found.  Updated Vital Signs BP (!) 167/95 (BP Location: Right Arm)   Pulse 60   Temp 97.9 F (36.6 C) (Oral)   Resp 18   SpO2 96%    Physical Exam Vitals and nursing note reviewed.  Constitutional:      General: She is not in acute distress.    Appearance: Normal appearance.  HENT:     Mouth/Throat:     Mouth: Mucous membranes are moist.     Pharynx: Oropharynx is clear. No posterior oropharyngeal erythema.  Eyes:     Conjunctiva/sclera: Conjunctivae normal.     Pupils: Pupils are equal, round, and reactive to light.  Cardiovascular:     Rate and Rhythm: Normal rate and regular rhythm.     Pulses: Normal pulses.     Heart sounds: Normal heart sounds.  Pulmonary:     Effort: Pulmonary effort is normal. No respiratory distress.     Breath sounds: Normal breath sounds. No wheezing.  Abdominal:     General: Bowel sounds are normal.     Tenderness: There is no abdominal tenderness.  Musculoskeletal:        General: Normal range of motion.     Cervical back: Normal range of motion.  Skin:    Findings: No rash.     Comments: No rash, lesions, or other skin changes noted. Patient is scratching at arms during visit   Neurological:     Mental Status: She is alert and oriented to person, place, and time.     UC Treatments / Results  Labs (all labs ordered are listed, but only abnormal results are displayed) Labs Reviewed - No data to display  EKG   Radiology No results found.  Procedures Procedures (including critical care time)  Medications Ordered in UC Medications  dexamethasone (DECADRON) injection 10 mg (10 mg Intramuscular Given 08/04/22 1041)    Initial Impression / Assessment and Plan / UC Course  I have reviewed the triage vital signs and the nursing notes.  Pertinent labs & imaging results that were available during my care of the patient were reviewed by me and considered in my medical decision making (see chart for details).  Pruritus IM steroid given in clinic for itch relief Recommend daily zyrtec nightly benadryl to keep itch controlled Unknown etiology but no red flags at this time. Discussed symptoms to monitor for and return/ED precautions. Patient agreeable to plan. No questions at this time  Final Clinical Impressions(s) / UC Diagnoses   Final diagnoses:  Pruritus     Discharge Instructions      Take one zyrtec tablet every morning and one benadryl tablet every night for the next few days or a week. These are good medications for itching. The steroid injection today should start to work in about 30 minutes to relieve itching. Please return if symptoms are not getting better!  [place] una tableta de zyrtec todas las maanas y [place] tableta de benadryl todas las noches durante los prximos das o [place]. Estos son buenos medicamentos para la picazn. 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 Regrese si los sntomas no mejoran!    ED Prescriptions     Medication Sig Dispense Auth. Provider   cetirizine (ZYRTEC ALLERGY) 10 MG tablet Take 1 tablet (10 mg total) by mouth daily. Una tableta en la  manana/dia 30 tablet Tracer Gutridge, PA-C   diphenhydrAMINE (BENADRYL) 25 MG tablet Take 1 tablet (25 mg total) by mouth at bedtime. Una tableta en la noche 30 tablet Tadarrius Burch, Lurena Joiner, PA-C      PDMP not reviewed this encounter.   Hafiz Irion, Lurena Joiner, [place] 08/04/22 1122

## 2022-08-04 NOTE — Discharge Instructions (Addendum)
Take one zyrtec tablet every morning and one benadryl tablet every night for the next few days or a week. These are good medications for itching. The steroid injection today should start to work in about 30 minutes to relieve itching. Please return if symptoms are not getting better!  [place] una tableta de zyrtec todas las maanas y [place] tableta de benadryl todas las noches durante los prximos das o [place]. Estos son buenos medicamentos para la picazn. 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 Regrese si los sntomas no mejoran!

## 2022-08-04 NOTE — ED Triage Notes (Signed)
Adult nurse service used for clinical intake.    Pt states she is a week ago she has itching all over her body which when she scratches her skin starts burning. She states she hasn't taken any meds. She is putting alcohol on her skin but it doesn't help. She states she feels like warts or blisters inside her skin busting and burning, but there is nothing on the outside.

## 2023-09-26 IMAGING — CT CT HEAD W/O CM
4 series · 15 of 47 positions shown, 17 images · non-contrast
Comparison: 01/20/2005 head CT

CLINICAL DATA: 61-year-old female with head injury and headache.



[Series 3: head wo · axial · 0.35mm/px · z∈[+1401,+1520]mm · 7 of 32 slices shown, 9 images]
[im 4/32  brain]
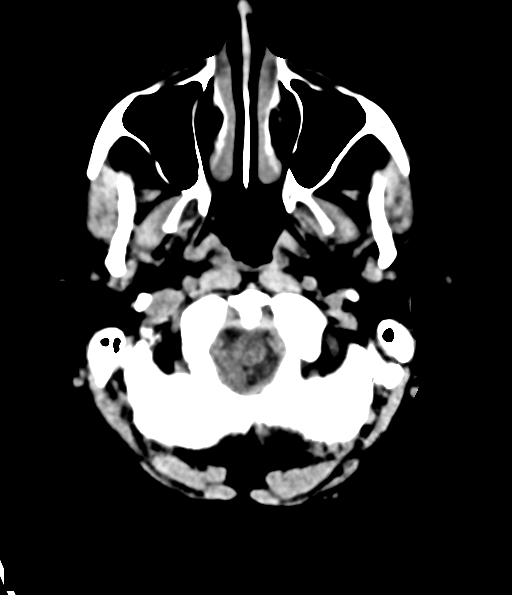
[im 4/32  bone]
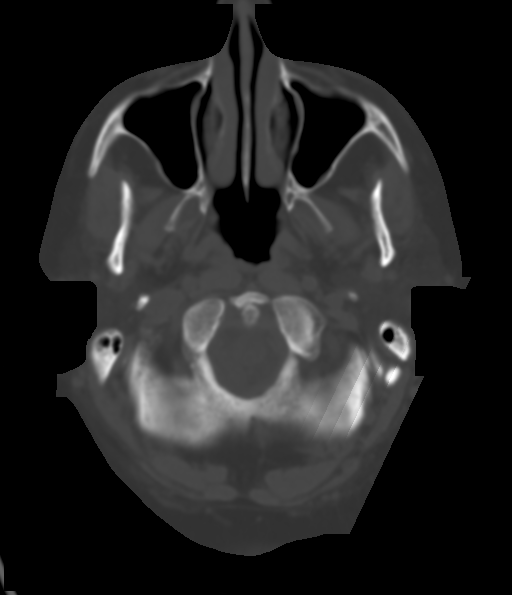
[im 8/32  brain]
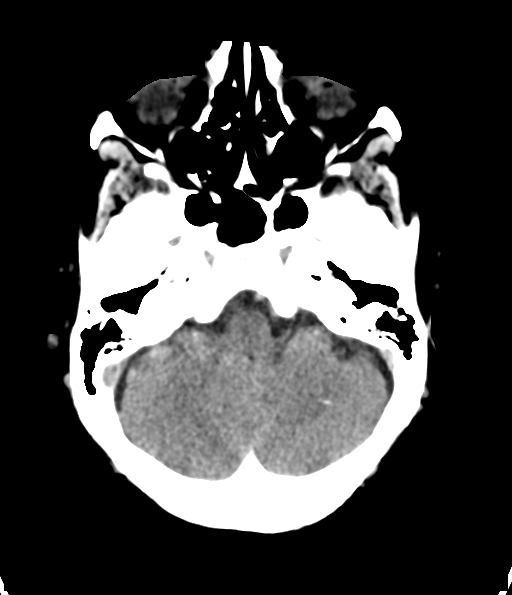
[im 12/32  brain]
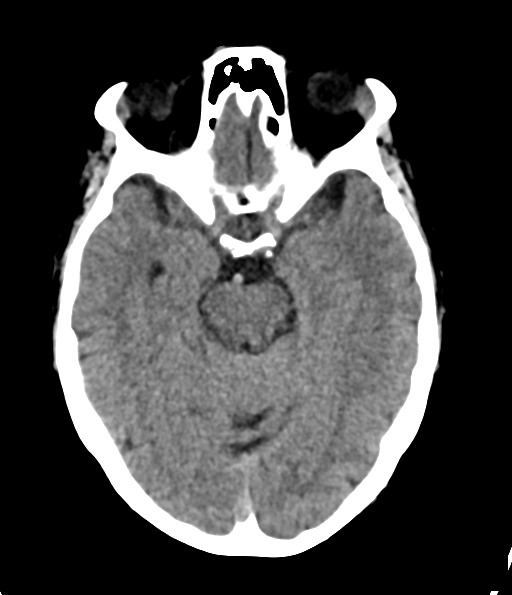
[im 16/32  brain]
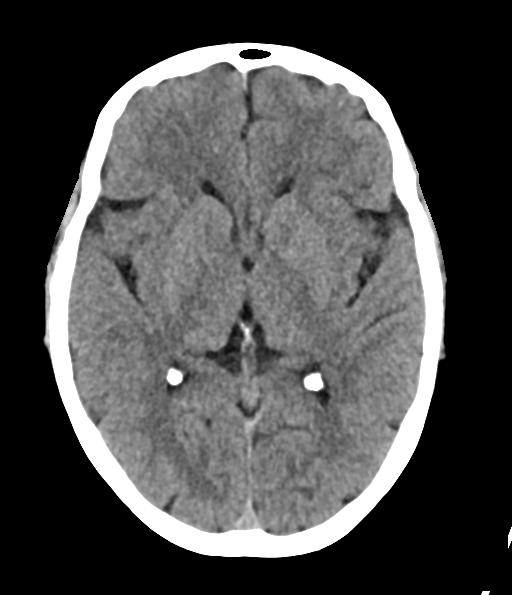
[im 20/32  brain]
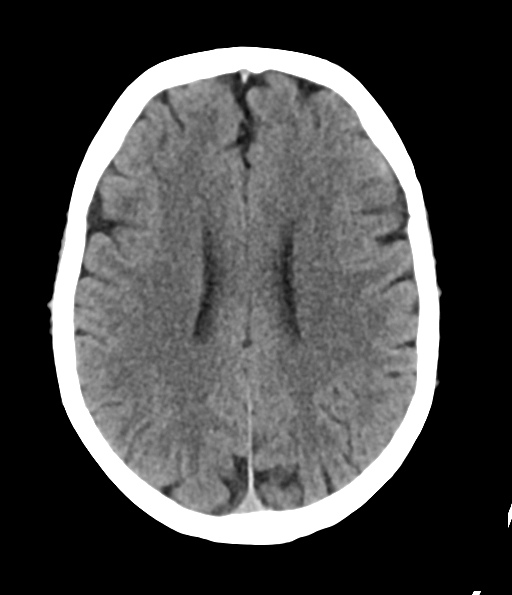
[im 20/32  bone]
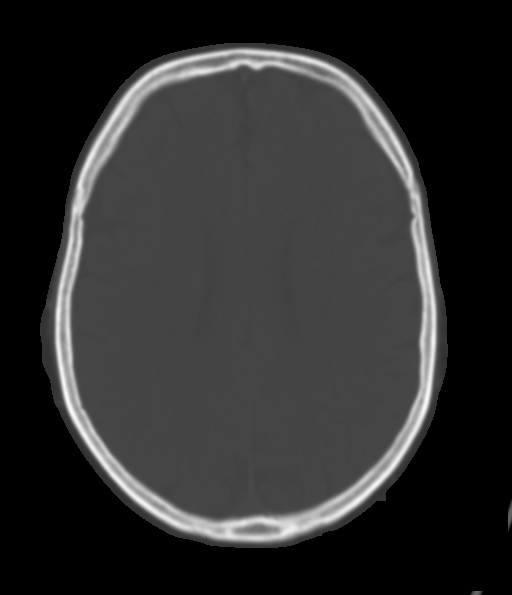
[im 24/32  brain]
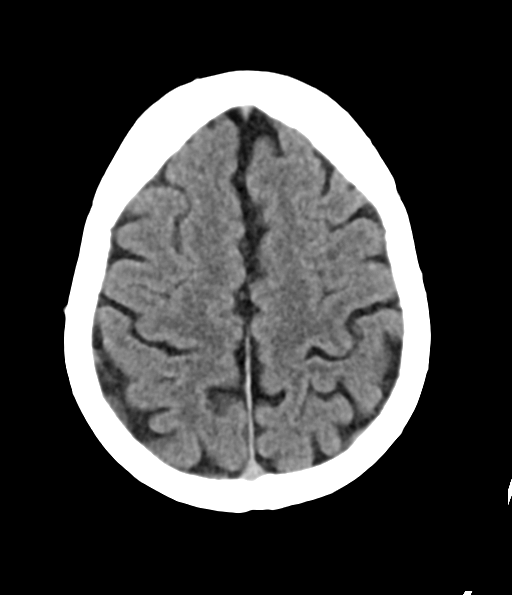
[im 28/32  brain]
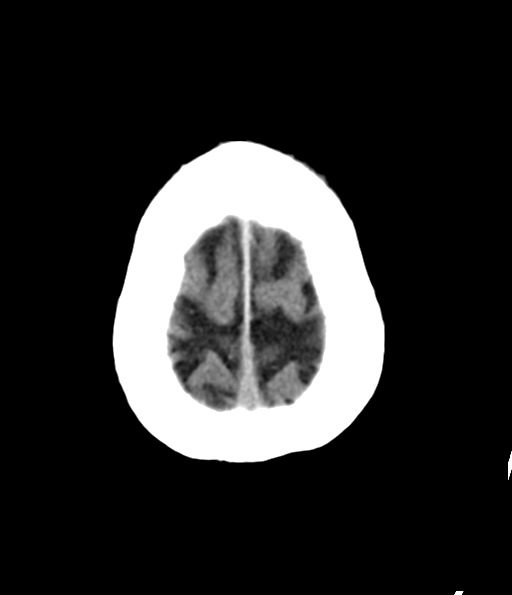

[Series 4: head bone · axial · 0.45mm/px · z∈[+1388,+1404]mm · 2 of 79 slices shown]
[im 8/79  bone]
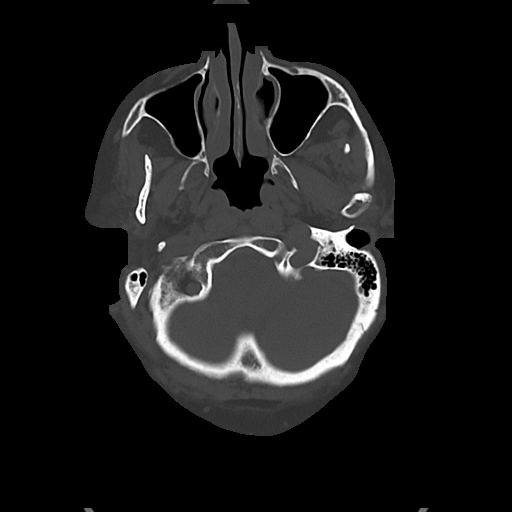
[im 16/79  bone]
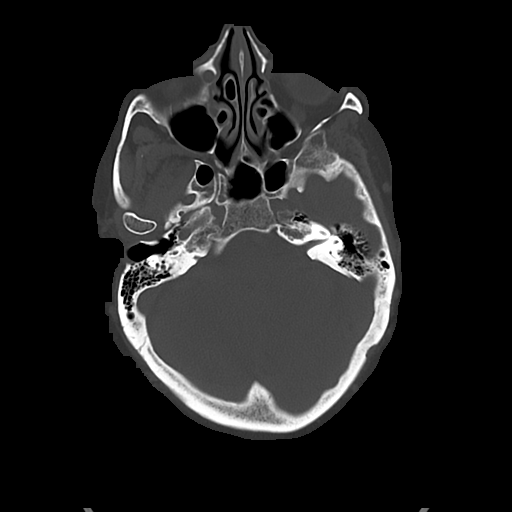

[Series 5: cor soft · coronal · 0.31mm/px · 3 of 70 slices shown]
[im 24/70  brain]
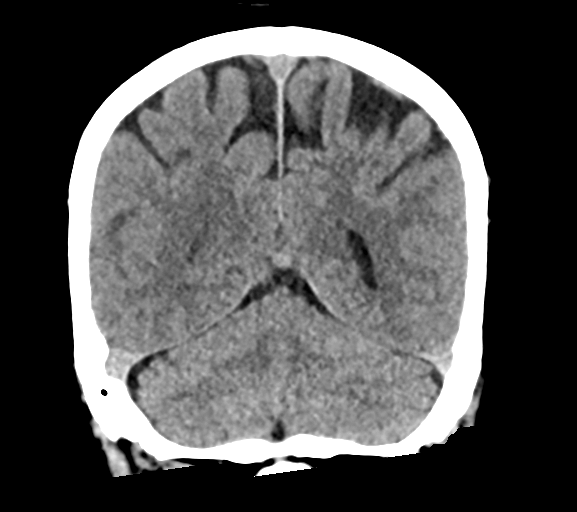
[im 31/70  brain]
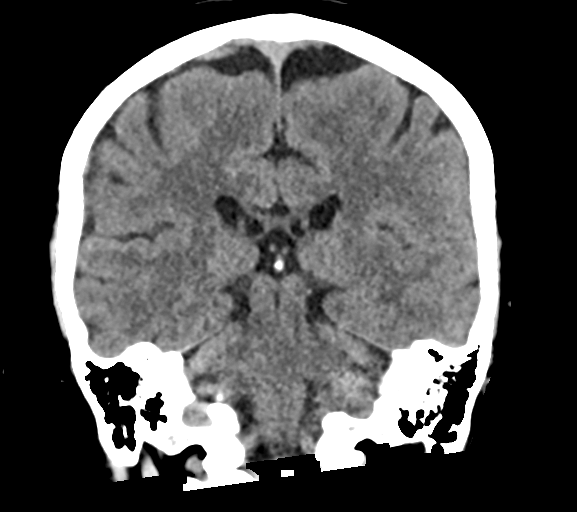
[im 39/70  brain]
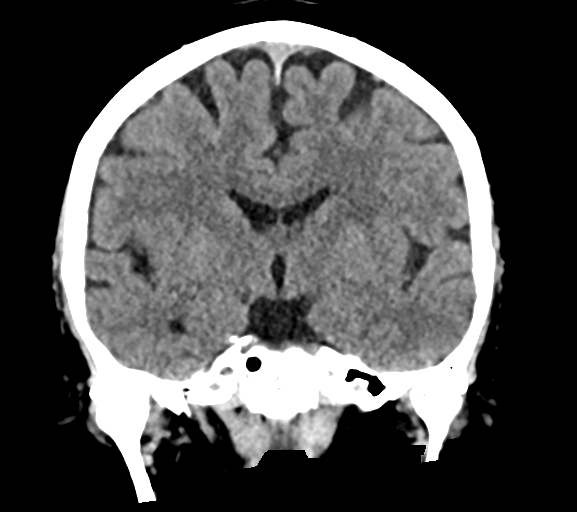

[Series 6: sag soft · sagittal · 0.31mm/px · 3 of 60 slices shown]
[im 22/60  brain]
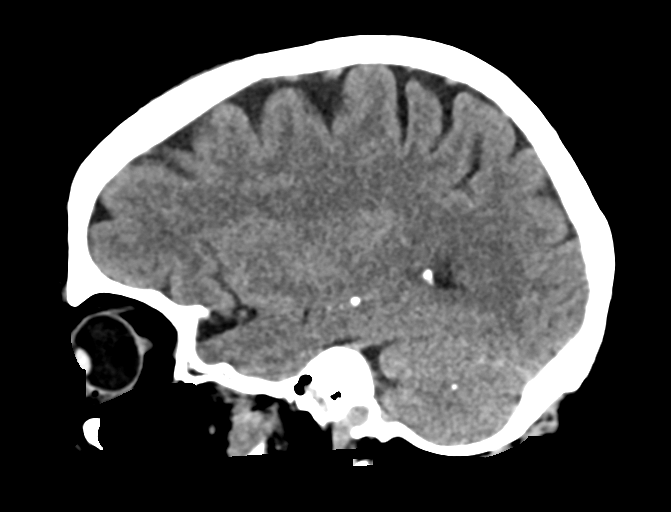
[im 30/60  brain]
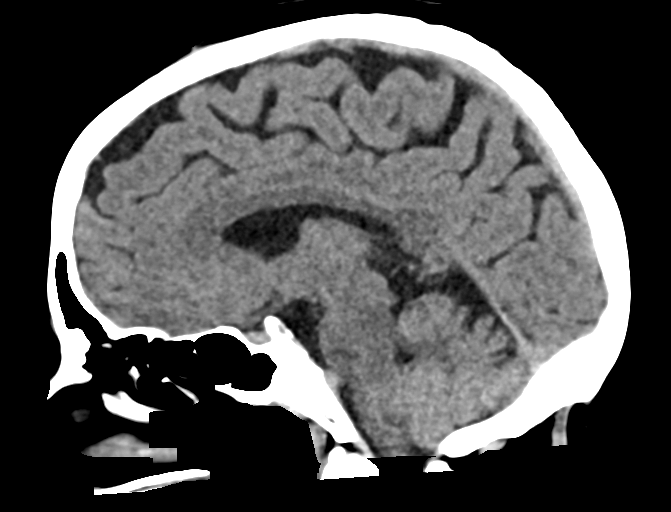
[im 38/60  brain]
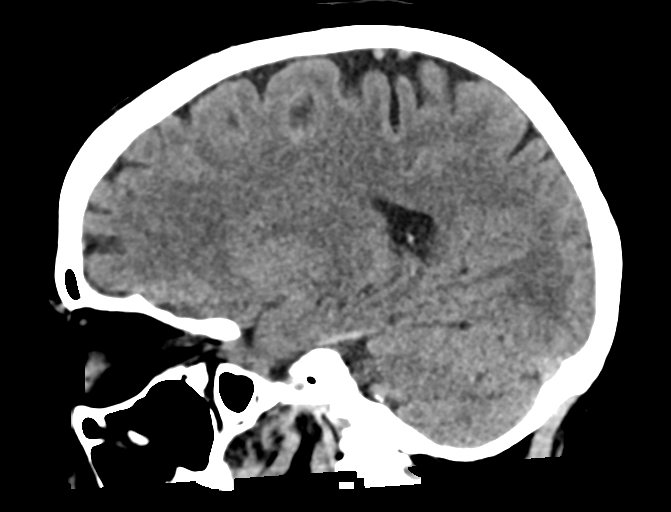

[15 of 47 positions shown; findings below may reference images not displayed]

FINDINGS: Brain: No evidence of acute infarction, hemorrhage, hydrocephalus,
extra-axial collection or mass lesion/mass effect.

Vascular: No hyperdense vessel or unexpected calcification.

Skull: Normal. Negative for fracture or focal lesion.

Sinuses/Orbits: No acute finding.

Other: None.
IMPRESSION: Unremarkable noncontrast head CT.
